# Patient Record
Sex: Male | Born: 1985 | Race: White | Hispanic: No | Marital: Married | Smoking: Former smoker
Health system: Southern US, Community
[De-identification: ages and names within clinical notes are randomized; demographics above are authoritative.]

## PROBLEM LIST (undated history)

## (undated) DIAGNOSIS — K121 Other forms of stomatitis: Secondary | ICD-10-CM

## (undated) HISTORY — PX: HYDROCELE EXCISION / REPAIR: SUR1145

## (undated) HISTORY — PX: OTHER SURGICAL HISTORY: SHX169

---

## 2010-06-28 ENCOUNTER — Ambulatory Visit: Payer: Self-pay | Admitting: Family Medicine

## 2010-06-28 DIAGNOSIS — L723 Sebaceous cyst: Secondary | ICD-10-CM

## 2010-06-29 ENCOUNTER — Ambulatory Visit: Payer: Self-pay | Admitting: Family Medicine

## 2010-06-30 ENCOUNTER — Encounter: Payer: Self-pay | Admitting: Family Medicine

## 2011-01-26 NOTE — Assessment & Plan Note (Signed)
Summary: R ear lobe swollen x 2 dys rm 2   Vital Signs:  Patient Profile:   25 Years Old Male CC:      R ear swollen x 2 dys Height:     72 inches Weight:      178 pounds O2 Sat:      100 % O2 treatment:    Room Air Temp:     97.0 degrees F oral Pulse rate:   61 / minute Pulse rhythm:   regular Resp:     16 per minute BP sitting:   135 / 84  (right arm) Cuff size:   regular  Vitals Entered By: Areta Haber CMA (June 28, 2010 1:17 PM)                  Current Allergies: No known allergies History of Present Illness Chief Complaint: R ear swollen x 2 dys History of Present Illness:  Subjective:  Patient complains of pain and swelling in his right ear lobe for 2 days.  He tried unsuccessfully to drain it with a needle.  Current Problems: SEBACEOUS CYST, INFECTED (ICD-706.2) FAMILY HISTORY DIABETES 1ST DEGREE RELATIVE (ICD-V18.0)   Current Meds CEPHALEXIN 500 MG TABS (CEPHALEXIN) One by mouth three times daily (every 8 hours)  REVIEW OF SYSTEMS Constitutional Symptoms      Denies fever, chills, night sweats, weight loss, weight gain, and fatigue.  Eyes       Denies change in vision, eye pain, eye discharge, glasses, contact lenses, and eye surgery. Ear/Nose/Throat/Mouth       Complains of ear pain.      Denies hearing loss/aids, change in hearing, ear discharge, dizziness, frequent runny nose, frequent nose bleeds, sinus problems, sore throat, hoarseness, and tooth pain or bleeding.      Comments: R, ear lobe, swollen - x 2 dys Respiratory       Denies dry cough, productive cough, wheezing, shortness of breath, asthma, bronchitis, and emphysema/COPD.  Cardiovascular       Denies murmurs, chest pain, and tires easily with exhertion.    Gastrointestinal       Denies stomach pain, nausea/vomiting, diarrhea, constipation, blood in bowel movements, and indigestion. Genitourniary       Denies painful urination, kidney stones, and loss of urinary  control. Neurological       Denies paralysis, seizures, and fainting/blackouts. Musculoskeletal       Denies muscle pain, joint pain, joint stiffness, decreased range of motion, redness, swelling, muscle weakness, and gout.  Skin       Denies bruising, unusual mles/lumps or sores, and hair/skin or nail changes.  Psych       Denies mood changes, temper/anger issues, anxiety/stress, speech problems, depression, and sleep problems. 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  Fluctuant. Assessment New Problems: SEBACEOUS CYST, INFECTED (ICD-706.2) FAMILY HISTORY DIABETES 1ST DEGREE RELATIVE (ICD-V18.0)   Plan New Medications/Changes: CEPHALEXIN 500 MG TABS (CEPHALEXIN) One by mouth three times daily (every 8 hours)  #  30 x 0, 06/28/2010, Donna Christen MD  New Orders: New Patient Level III [phone] T-Culture, Wound [87070/[id number]] I&D Abscess, Simple / Single [10060] Planning Comments:   Wound culture pending.  Begin Keflex Return tomorrow for packing removal.  Wound precautions.  May take Ibuprofen 200mg , 4 tabs every 8 hours with food as needed   The patient and/or caregiver has been counseled thoroughly with regard to medications prescribed including dosage, schedule, interactions, rationale for use, and possible side effects and they verbalize understanding.  Diagnoses and expected course of recovery discussed and will return if not improved as expected or if the  condition worsens. Patient and/or caregiver verbalized understanding.   PROCEDURE:   I & D Site: Right ear lobule Anesthesia: 1% lidocaine with epinephrine Procedure: Procedure:  Incise and Drain Abscess Risks and benefits of procedure explained to patient and verbal consent granted.  Using sterile technique and local anesthesia with 1% lidocaine with epinephrine, cleansed affected area with Betadine and saline. Identified the most fluctuant area of lesion and incised with a #11 blade.  Expressed approximately 0.5cc blood and purulent material.  Inserted Iodoform gauze packing.  Bandage applied.  Patient tolerated well.  Disposition: Home Prescriptions: CEPHALEXIN 500 MG TABS (CEPHALEXIN) One by mouth three times daily (every 8 hours)  #30 x 0   Entered and Authorized by:   Donna Christen MD   Signed by:   Donna Christen MD on 06/28/2010   Method used:   Print then Give to Patient   RxID:   [id number]   Orders Added: 1)  New Patient Level III [95621] 2)  T-Culture, Wound [87070/87205-70190] 3)  I&D Abscess, Simple / Single [10060]

## 2011-01-26 NOTE — Assessment & Plan Note (Signed)
Summary: FOLLOW UP FOR EAR/TJ  CHIEF COMPLAINT:  Returns for re-check of right ear    ALLERGIES:  None known  Past History, Family History, Social History previously recorded  Subjective:  Patient has no complaints.  He has had no further pain in right ear  Objective:  Right ear lobe:  removed packing.  No purulent drainage noted.  Minimal swelling/tenderness.  Assessment:   Abscess/cyst resolving  Plan:   Bandage applied.  Change bandage daily until healed.  Continue antibiotic.  Apply warm soaks daily. Return for increasing pain/swelling/redness, etc.        Current Allergies: No known allergies   The patient and/or caregiver has been counseled thoroughly with regard to medications prescribed including dosage, schedule, interactions, rationale for use, and possible side effects and they verbalize understanding.  Diagnoses and expected course of recovery discussed and will return if not improved as expected or if the condition worsens. Patient and/or caregiver verbalized understanding.   Orders Added: 1)  No Charge Patient Arrived (NCPA0) [NCPA0]

## 2011-10-18 ENCOUNTER — Inpatient Hospital Stay (INDEPENDENT_AMBULATORY_CARE_PROVIDER_SITE_OTHER)
Admission: RE | Admit: 2011-10-18 | Discharge: 2011-10-18 | Disposition: A | Discharge status: Home / Self Care | Payer: 59 | Source: Ambulatory Visit | Attending: Family Medicine | Admitting: Family Medicine

## 2011-10-18 ENCOUNTER — Encounter: Payer: Self-pay | Admitting: Family Medicine

## 2011-10-18 DIAGNOSIS — L723 Sebaceous cyst: Secondary | ICD-10-CM

## 2011-10-19 ENCOUNTER — Encounter: Payer: Self-pay | Admitting: Family Medicine

## 2011-10-19 ENCOUNTER — Inpatient Hospital Stay (INDEPENDENT_AMBULATORY_CARE_PROVIDER_SITE_OTHER)
Admission: RE | Admit: 2011-10-19 | Discharge: 2011-10-19 | Disposition: A | Discharge status: Home / Self Care | Payer: 59 | Source: Ambulatory Visit | Attending: Family Medicine | Admitting: Family Medicine

## 2011-10-19 DIAGNOSIS — L723 Sebaceous cyst: Secondary | ICD-10-CM

## 2011-10-19 DIAGNOSIS — Z4801 Encounter for change or removal of surgical wound dressing: Secondary | ICD-10-CM

## 2011-10-21 ENCOUNTER — Telehealth (INDEPENDENT_AMBULATORY_CARE_PROVIDER_SITE_OTHER): Payer: Self-pay | Admitting: *Deleted

## 2011-11-29 NOTE — Progress Notes (Signed)
Summary: Recheck ear? procedure rm   Vital Signs:  Patient Profile:   25 Years Old Male CC:      recheck ear Height:     72 inches O2 Sat:      99 % O2 treatment:    Room Air Temp:     98.3 degrees F oral Pulse rate:   73 / minute Resp:     16 per minute BP sitting:   120 / 72  (left arm) Cuff size:   regular  Vitals Entered By: Clemens Catholic LPN (October 19, 2011 6:06 PM)                  Updated Prior Medication List: LORTAB 5 5-500 MG TABS (HYDROCODONE-ACETAMINOPHEN) One or two tabs by mouth hs as needed pain CEPHALEXIN 500 MG TABS (CEPHALEXIN) One by mouth three times daily (every 8 hours)  Current Allergies (reviewed today): No known allergies History of Present Illness Chief Complaint: recheck ear History of Present Illness:  Subjective:  Patient reports that all pain in right earlobe has resolved  REVIEW OF SYSTEMS Constitutional Symptoms      Denies fever, chills, night sweats, weight loss, weight gain, and fatigue.  Eyes       Denies change in vision, eye pain, eye discharge, glasses, contact lenses, and eye surgery. Ear/Nose/Throat/Mouth       Denies hearing loss/aids, change in hearing, ear pain, ear discharge, dizziness, frequent runny nose, frequent nose bleeds, sinus problems, sore throat, hoarseness, and tooth pain or bleeding.  Respiratory       Denies dry cough, productive cough, wheezing, shortness of breath, asthma, bronchitis, and emphysema/COPD.  Cardiovascular       Denies murmurs, chest pain, and tires easily with exhertion.    Gastrointestinal       Denies stomach pain, nausea/vomiting, diarrhea, constipation, blood in bowel movements, and indigestion. Genitourniary       Denies painful urination, kidney stones, and loss of urinary control. Neurological       Denies paralysis, seizures, and fainting/blackouts. Musculoskeletal       Denies muscle pain, joint pain, joint stiffness, decreased range of motion, redness, swelling, muscle  weakness, and gout.  Skin       Denies bruising, unusual mles/lumps or sores, and hair/skin or nail changes.  Psych       Denies mood changes, temper/anger issues, anxiety/stress, speech problems, depression, and sleep problems. Other Comments: Recheck ear.   Past History:  Past Medical History: Reviewed history from 06/28/2010 and no changes required. Unremarkable  Past Surgical History: Reviewed history from 06/28/2010 and no changes required. 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 Family History Diabetes 1st degree relative  Social History: Reviewed history from 06/28/2010 and no changes required. Married Never Smoked Alcohol use-yes - weekends - few Drug use-no Regular exercise-yes   Objective:  Appearance:  Patient appears healthy, stated age, and in no acute distress  Reight ear:  bandage and surgical wick removed:  Mild swelling, no purulent discharge, nontender, decreased erythema Assessment  Assessed SEBACEOUS CYST, INFECTED as improved - Donna Christen MD New Problems: ENCOUNTER CHANGE/REMOVAL SURGICAL WOUND DRESSING (ICD-V58.31)   Plan New Orders: Est. Patient Level II [16109] Planning Comments:   Bandage reapplied; advised to change bandage daily until healed.  Finish antibiotic.  Culture pending. Recommend follow-up with plastic surgeon when healed for cyst resection.   The patient and/or caregiver has been counseled thoroughly  with regard to medications prescribed including dosage, schedule, interactions, rationale for use, and possible side effects and they verbalize understanding.  Diagnoses and expected course of recovery discussed and will return if not improved as expected or if the condition worsens. Patient and/or caregiver verbalized understanding.   Orders Added: 1)  Est. Patient Level II [16109]

## 2011-11-29 NOTE — Progress Notes (Signed)
Summary: EAR CYST...[facility](procedure room)   Vital Signs:  Patient Profile:   25 Years Old Male CC:      cyst in ear Height:     72 inches Weight:      162 pounds O2 Sat:      99 % O2 treatment:    Room Air Temp:     98.5 degrees F oral Pulse rate:   65 / minute Resp:     20 per minute BP sitting:   124 / 79  (left arm) Cuff size:   regular  Vitals Entered By: Linton Flemings RN (October 18, 2011 6:00 PM)                  Updated Prior Medication List: No Medications Current Allergies: No known allergies History of Present Illness Chief Complaint: cyst in ear History of Present Illness:  Subjective:  Patient complains of recurrent painful cyst on his right ear lobe that has become swollen and painful over the past 3 days.  REVIEW OF SYSTEMS Constitutional Symptoms      Denies fever, chills, night sweats, weight loss, weight gain, and fatigue.  Eyes       Complains of eye pain.      Denies change in vision, eye discharge, glasses, contact lenses, and eye surgery. Ear/Nose/Throat/Mouth       Denies hearing loss/aids, change in hearing, ear pain, ear discharge, dizziness, frequent runny nose, frequent nose bleeds, sinus problems, sore throat, hoarseness, and tooth pain or bleeding.  Respiratory       Denies dry cough, productive cough, wheezing, shortness of breath, asthma, bronchitis, and emphysema/COPD.  Cardiovascular       Denies murmurs, chest pain, and tires easily with exhertion.    Gastrointestinal       Denies stomach pain, nausea/vomiting, diarrhea, constipation, blood in bowel movements, and indigestion. Genitourniary       Denies painful urination, kidney stones, and loss of urinary control. Neurological       Complains of headaches.      Denies paralysis, seizures, and fainting/blackouts. Musculoskeletal       Denies muscle pain, joint pain, joint stiffness, decreased range of motion, redness, swelling, muscle weakness, and gout.  Skin       Denies  bruising, unusual mles/lumps or sores, and hair/skin or nail changes.  Psych       Denies mood changes, temper/anger issues, anxiety/stress, speech problems, depression, and sleep problems. Other Comments: cyst to back of right ear lobe, states he had one last year   Past History:  Past Medical History: Reviewed history from 06/28/2010 and no changes required. Unremarkable  Past Surgical History: Reviewed history from 06/28/2010 and no changes required. 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 Family History Diabetes 1st degree relative  Social History: Reviewed history from 06/28/2010 and no changes required. Married Never Smoked Alcohol use-yes - weekends - few Drug use-no Regular exercise-yes   Objective:  Appearance:  Patient appears healthy, stated age, and in no acute distress  Right external ear:  There is a 1cm dia tender swollen fluctuant erythematous cystic lesion on posterior aspect of lobule Assessment New Problems: SEBACEOUS CYST, INFECTED (ICD-706.2)  RECURRENT SEBACEOUS CYST, INFECTED  Plan New Medications/Changes: CEPHALEXIN 500 MG TABS (CEPHALEXIN) One by mouth three times daily (every 8 hours)  #30 x 0, 10/18/2011, Donna Christen MD LORTAB 5 5-500 MG TABS (HYDROCODONE-ACETAMINOPHEN) One or two tabs by mouth  hs as needed pain  #10 (ten) x 0, 10/18/2011, Donna Christen MD  New Orders: T-Culture, Wound [[id number]] I&D Abscess, Simple / Single [10060] Est. Patient Level III [08657] Services provided After hours-Weekends-Holidays [99051] Planning Comments:   Wound culture taken.  Begin Keflex. Analgesic for bedtime. Return tomorrow for packing removal and bandage change Recommend follow-up with plastic surgeon when well for definitive removal of cyst.   The patient and/or caregiver has been counseled thoroughly with regard to medications prescribed including dosage,  schedule, interactions, rationale for use, and possible side effects and they verbalize understanding.  Diagnoses and expected course of recovery discussed and will return if not improved as expected or if the condition worsens. Patient and/or caregiver verbalized understanding.   PROCEDURE:   I & D Site: Right ear lobule Size: 1cm Anesthesia: Local plain 1% lidocaine Procedure: Procedure:  Incise and Drain Abscess Risks and benefits of procedure explained to patient and verbal consent granted.  Using sterile technique and local anesthesia with 1% lidocaine with epinephrine, cleansed affected area with Betadine and saline. Identified the most fluctuant area of lesion and incised with a #11 blade.  Expressed  blood and purulent sebaceous material.  Inserted Iodoform gauze packing.  Bandage applied.  Patient tolerated well.  Disposition: Home Specimen sent to lab for evaluation. Prescriptions: CEPHALEXIN 500 MG TABS (CEPHALEXIN) One by mouth three times daily (every 8 hours)  #30 x 0   Entered and Authorized by:   Donna Christen MD   Signed by:   Donna Christen MD on 10/18/2011   Method used:   Print then Give to Patient   RxID:   [id number] LORTAB 5 5-500 MG TABS (HYDROCODONE-ACETAMINOPHEN) One or two tabs by mouth hs as needed pain  #10 (ten) x 0   Entered and Authorized by:   Donna Christen MD   Signed by:   Donna Christen MD on 10/18/2011   Method used:   Print then Give to Patient   RxID:   [id number]   Orders Added: 1)  T-Culture, Wound [87070/[id number]] 2)  I&D Abscess, Simple / Single [10060] 3)  Est. Patient Level III [42595] 4)  Services provided After hours-Weekends-Holidays [99051]

## 2011-11-29 NOTE — Telephone Encounter (Signed)
  Phone Note Outgoing Call Call back at [facility] Phone [phone]   Call placed by: Lajean Saver RN,  October 21, 2011 2:34 PM Summary of Call: Callback: No answer. Message left with culture result and call back with questions or concerns.

## 2011-11-29 NOTE — Letter (Signed)
Summary: Out of Work  [facility]  [address]   [place], [place] [address]   Phone: [phone]  Fax: [phone]    October 18, 2011   Employee:  KEENE GILKEY    To Whom It May Concern:   For Medical reasons, please excuse the above named employee from work tomorrow.   If you need additional information, please feel free to contact our office.         Sincerely,    Donna Christen MD

## 2012-08-18 ENCOUNTER — Emergency Department
Admission: EM | Admit: 2012-08-18 | Discharge: 2012-08-18 | Disposition: A | Discharge status: Home / Self Care | Payer: 59 | Source: Home / Self Care

## 2012-08-18 ENCOUNTER — Encounter: Payer: Self-pay | Admitting: Emergency Medicine

## 2012-08-18 DIAGNOSIS — K120 Recurrent oral aphthae: Secondary | ICD-10-CM

## 2012-08-18 HISTORY — DX: Other forms of stomatitis: K12.1

## 2012-08-18 MED ORDER — ONE-DAILY MULTI VITAMINS PO TABS: 1.0000 | ORAL_TABLET | Freq: Every day | ORAL | Status: DC

## 2012-08-18 NOTE — ED Provider Notes (Signed)
Agree with exam, assessment, and plan.   Lattie Haw, MD 08/18/12 Windell Moment

## 2012-08-18 NOTE — ED Provider Notes (Signed)
History     CSN: [id number]  Arrival date & time 08/18/12  1528     Chief Complaint  Patient presents with  . Mouth Lesions     HPI Comments: Patient presents today with a c/o of a ulcer on his right pharynx for 5 days.  Denies any pain in throat.  However, does relate, "feels like a popcorn kernel stuck in the back of my throat."  Heat, acidic foods, and stress makes the swelling in throat worse; rest and cool foods makes it better.  Denies fever, chills, headache, nausea/vomiting.  Does relate having postnasal drip prior to the onset of ulcer.  Denies presentation of ulcers/blisters on genitalia.    Patient relates having a hx of aphthous ulcers in the past.  Usually happens once or twice a year.  They are located at inside of lower lip and buccal area. None presented around outside of mouth.  No treatment required in past, usually heals on own in 2-3 days.    The history is provided by the patient.    Past Medical History  Diagnosis Date  . Mouth ulcers     Past Surgical History  Procedure Date  . Cystectomy earlobe     Family History  Problem Relation Age of Onset  . Diabetes Mother   . Stroke Father     History  Substance Use Topics  . Smoking status: Never Smoker   . Smokeless tobacco: Not on file  . Alcohol Use: Yes      Review of Systems  Constitutional: Negative.   HENT:       Ulcer on right pharynx.  "Feels like a popcorn kernel stuck in the back of throat."  Respiratory: Negative.   Cardiovascular: Negative.   Gastrointestinal: Negative.   Genitourinary: Negative.   All other systems reviewed and are negative.    Allergies  Review of patient's allergies indicates no known allergies.  Home Medications   Current Outpatient Rx  Name Route Sig Dispense Refill  . ONE-DAILY MULTI VITAMINS PO TABS Oral Take 1 tablet by mouth daily. 30 tablet 12    BP 128/76  Pulse 68  Temp 97.8 F (36.6 C) (Oral)  Resp 16  Ht 6' (1.829 m)  Wt 173 lb (78.472  kg)  BMI 23.46 kg/m2  SpO2 100%  Physical Exam  Nursing note and vitals reviewed. Constitutional: He is oriented to person, place, and time. He appears well-developed and well-nourished.  HENT:  Head: Normocephalic and atraumatic.  Right Ear: External ear normal.  Left Ear: External ear normal.  Nose: Nose normal.  Mouth/Throat: Posterior oropharyngeal erythema present.         Two ulcers present on right pharynx. + Erythema.  No edema.  Eyes: Conjunctivae and EOM are normal. Pupils are equal, round, and reactive to light.  Neck: Normal range of motion. Neck supple. No thyromegaly present.  Cardiovascular: Normal rate, regular rhythm, normal heart sounds and intact distal pulses.  Exam reveals no friction rub.   No murmur heard. Pulmonary/Chest: Effort normal and breath sounds normal.  Abdominal: Soft. Bowel sounds are normal.  Musculoskeletal: Normal range of motion.  Lymphadenopathy:    He has no cervical adenopathy.  Neurological: He is alert and oriented to person, place, and time.  Skin: Skin is warm and dry.  Psychiatric: He has a normal mood and affect.    ED Course  Procedures    Labs Reviewed  HERPES SIMPLEX VIRUS CULTURE: Pending      1.  Aphthous ulcer of pharynx or hypopharynx       MDM  Most likely Aphthous ulcer of the pharynx.   HSV culture taken to r/o Herpes Simplex 1.  If HSV1 is positive will call in acyclovir. Take multivitamin 1 tab once a day, everyday because any sores in the mouth can be caused by vitamin deficiency. Handout given from [facility] about Aphthous Ulcers. Recommended to make an appointment with ENT specialist if symptoms do not improve or if worsen in 5 weeks.          Junius Roads, NP 08/18/12 [phone]

## 2012-08-18 NOTE — ED Notes (Signed)
Patient reports hx of mouth ulcerations which always spontaneously clear; now has one on right tonsil x 5 days that is not going away. No recent OTCs.

## 2012-08-21 LAB — HERPES SIMPLEX VIRUS CULTURE: Organism ID, Bacteria: NOT DETECTED

## 2012-08-23 ENCOUNTER — Telehealth: Payer: Self-pay | Admitting: *Deleted

## 2012-12-09 ENCOUNTER — Emergency Department
Admission: EM | Admit: 2012-12-09 | Discharge: 2012-12-09 | Disposition: A | Discharge status: Home / Self Care | Payer: Self-pay | Source: Home / Self Care | Attending: Family Medicine | Admitting: Family Medicine

## 2012-12-09 ENCOUNTER — Encounter: Payer: Self-pay | Admitting: *Deleted

## 2012-12-09 DIAGNOSIS — S91009A Unspecified open wound, unspecified ankle, initial encounter: Secondary | ICD-10-CM

## 2012-12-09 DIAGNOSIS — S81012A Laceration without foreign body, left knee, initial encounter: Secondary | ICD-10-CM

## 2012-12-09 NOTE — ED Provider Notes (Signed)
History     CSN: [id number]  Arrival date & time 12/09/12  1400   First MD Initiated Contact with Patient 12/09/12 1421      Chief Complaint  Patient presents with  . Laceration     HPI Comments: The patient cut his left anterior knee with a box cutter just prior to arrival.  He states that he had a Tdap about 3 weeks ago.  Patient is a 26 y.o. male presenting with skin laceration. The history is provided by the patient.  Laceration  The incident occurred less than 1 hour ago. Pain location: left knee. Size: 1.5cm. The laceration mechanism was a a clean knife. The pain is mild. The pain has been constant since onset. He reports no foreign bodies present. His tetanus status is UTD.    Past Medical History  Diagnosis Date  . Mouth ulcers     Past Surgical History  Procedure Date  . Cystectomy earlobe     Family History  Problem Relation Age of Onset  . Diabetes Mother   . Stroke Father     History  Substance Use Topics  . Smoking status: Never Smoker   . Smokeless tobacco: Not on file  . Alcohol Use: Yes      Review of Systems  All other systems reviewed and are negative.    Allergies  Review of patient's allergies indicates no known allergies.  Home Medications   Current Outpatient Rx  Name  Route  Sig  Dispense  Refill  . ONE-DAILY MULTI VITAMINS PO TABS   Oral   Take 1 tablet by mouth daily.   30 tablet   12     BP 135/81  Pulse 65  Temp 97.5 F (36.4 C) (Oral)  Resp 20  Ht 6' (1.829 m)  Wt 182 lb (82.555 kg)  BMI 24.68 kg/m2  SpO2 98%  Physical Exam  Nursing note and vitals reviewed. Constitutional: He is oriented to person, place, and time. He appears well-developed and well-nourished. No distress.  HENT:  Head: Atraumatic.  Eyes: Conjunctivae normal are normal. Pupils are equal, round, and reactive to light.  Neurological: He is alert and oriented to person, place, and time.  Skin: Skin is warm and dry.          There is a  1.5cm long superficial linear laceration over the medial aspect of left knee as noted on diagram.  The laceration extends to subcutaneous fat.  No foreign body noted.    ED Course  Procedures   Laceration Repair Discussed benefits and risks of procedure and verbal consent obtained. Using sterile technique and local 1% lidocaine with epinephrine, cleansed wound with Betadine followed by copious lavage with normal saline.  Wound carefully inspected for debris and foreign bodies; none found.  Wound closed with #3, 4-0 interrupted nylon sutures.  Bacitracin and non-stick sterile dressing applied.  Wound precautions explained to patient.  Return for suture removal in 12 days.       1. Laceration of left knee       MDM  Keep wound clean and dry.  Change bandage daily and apply Bacitracin ointment.  Return for any signs of infection.  Discussed wound precautions. Return 12 days for suture removal        Lattie Haw, MD 12/09/12 [phone]

## 2012-12-09 NOTE — ED Notes (Addendum)
Patient has laceration on knee cut himself with a box cutter 35-40 min ago. Patient had Tetanus shot 3 wks ago.

## 2012-12-21 ENCOUNTER — Emergency Department
Admission: EM | Admit: 2012-12-21 | Discharge: 2012-12-21 | Disposition: A | Discharge status: Home / Self Care | Payer: Self-pay | Source: Home / Self Care

## 2012-12-21 ENCOUNTER — Encounter: Payer: Self-pay | Admitting: *Deleted

## 2012-12-21 DIAGNOSIS — Z4802 Encounter for removal of sutures: Secondary | ICD-10-CM

## 2012-12-21 NOTE — ED Notes (Signed)
Patient here to get sutures removed.

## 2012-12-21 NOTE — ED Provider Notes (Signed)
History     CSN: [id number]  Arrival date & time 12/21/12  1004   None     Chief Complaint  Patient presents with  . Suture / Staple Removal   Patient is a 26 y.o. male presenting with suture removal.  Suture / Staple Removal  The sutures were placed 11 to 14 days ago. Treatments since wound repair include antibiotic ointment use. Fever duration: no fever  There has been no drainage from the wound. There is no redness present. There is no swelling present. The pain has no pain. He has no difficulty moving the affected extremity or digit.    Past Medical History  Diagnosis Date  . Mouth ulcers     Past Surgical History  Procedure Date  . Cystectomy earlobe     Family History  Problem Relation Age of Onset  . Diabetes Mother   . Stroke Father     History  Substance Use Topics  . Smoking status: Never Smoker   . Smokeless tobacco: Not on file  . Alcohol Use: Yes      Review of Systems  All other systems reviewed and are negative.    Allergies  Review of patient's allergies indicates no known allergies.  Home Medications   Current Outpatient Rx  Name  Route  Sig  Dispense  Refill  . ONE-DAILY MULTI VITAMINS PO TABS   Oral   Take 1 tablet by mouth daily.   30 tablet   12     BP 126/85  Pulse 62  Temp 97.9 F (36.6 C) (Oral)  Resp 16  Ht 6' (1.829 m)  Wt 180 lb 12 oz (81.988 kg)  BMI 24.51 kg/m2  SpO2 99%  Physical Exam  Constitutional: He appears well-developed and well-nourished.  HENT:  Head: Normocephalic and atraumatic.  Eyes: Conjunctivae normal are normal. Pupils are equal, round, and reactive to light.  Neck: Normal range of motion. Neck supple.  Cardiovascular: Normal rate, regular rhythm and normal heart sounds.   Pulmonary/Chest: Effort normal and breath sounds normal.  Abdominal: Soft. Bowel sounds are normal.  Musculoskeletal:       Legs:      Laceration overall well healed.  No erythema or purulence   Neurological: He is  alert.  Skin: Skin is warm.    ED Course  Procedures (including critical care time)  Labs Reviewed - No data to display No results found.   1. Visit for suture removal       MDM  Sutures removed at bedside.  Overall area well healed.  Discussed general care and infectious red flags.     The patient and/or caregiver has been counseled thoroughly with regard to treatment plan and/or medications prescribed including dosage, schedule, interactions, rationale for use, and possible side effects and they verbalize understanding. Diagnoses and expected course of recovery discussed and will return if not improved as expected or if the condition worsens. Patient and/or caregiver verbalized understanding.             Doree Albee, MD 12/21/12 [phone]

## 2014-01-09 ENCOUNTER — Emergency Department
Admission: EM | Admit: 2014-01-09 | Discharge: 2014-01-09 | Disposition: A | Discharge status: Home / Self Care | Payer: Self-pay | Source: Home / Self Care | Attending: Family Medicine | Admitting: Family Medicine

## 2014-01-09 ENCOUNTER — Encounter: Payer: Self-pay | Admitting: Emergency Medicine

## 2014-01-09 ENCOUNTER — Emergency Department (INDEPENDENT_AMBULATORY_CARE_PROVIDER_SITE_OTHER): Payer: Self-pay

## 2014-01-09 DIAGNOSIS — R071 Chest pain on breathing: Secondary | ICD-10-CM

## 2014-01-09 DIAGNOSIS — R05 Cough: Secondary | ICD-10-CM

## 2014-01-09 DIAGNOSIS — R0781 Pleurodynia: Secondary | ICD-10-CM

## 2014-01-09 DIAGNOSIS — R079 Chest pain, unspecified: Secondary | ICD-10-CM

## 2014-01-09 DIAGNOSIS — R059 Cough, unspecified: Secondary | ICD-10-CM

## 2014-01-09 MED ORDER — MELOXICAM 15 MG PO TABS: 15.0000 mg | ORAL_TABLET | Freq: Every day | ORAL | Status: DC

## 2014-01-09 NOTE — Discharge Instructions (Signed)
May take Tylenol for pain.  Apply ice pack two or three times daily.   Chest Wall Pain Chest wall pain is pain in or around the bones and muscles of your chest. It may take up to 6 weeks to get better. It may take longer if you must stay physically active in your work and activities.  CAUSES  Chest wall pain may happen on its own. However, it may be caused by:  A viral illness like the flu.  Injury.  Coughing.  Exercise.  Arthritis.  Fibromyalgia.  Shingles. HOME CARE INSTRUCTIONS   Avoid overtiring physical activity. Try not to strain or perform activities that cause pain. This includes any activities using your chest or your abdominal and side muscles, especially if heavy weights are used.  Put ice on the sore area.  Put ice in a plastic bag.  Place a towel between your skin and the bag.  Leave the ice on for 15-20 minutes per hour while awake for the first 2 days.  Only take over-the-counter or prescription medicines for pain, discomfort, or fever as directed by your caregiver. SEEK IMMEDIATE MEDICAL CARE IF:   Your pain increases, or you are very uncomfortable.  You have a fever.  Your chest pain becomes worse.  You have new, unexplained symptoms.  You have nausea or vomiting.  You feel sweaty or lightheaded.  You have a cough with phlegm (sputum), or you cough up blood. MAKE SURE YOU:   Understand these instructions.  Will watch your condition.  Will get help right away if you are not doing well or get worse. Document Released: 12/13/2005 Document Revised: 03/06/2012 Document Reviewed: 08/09/2011 [facility] Patient Information 2014 [place], [place].

## 2014-01-09 NOTE — ED Notes (Signed)
Evan Porter reports possibly having the flu the week of Christmas. Since, the cough has persisted. About 2 days ago he developed right sided CP with laughing, coughing and deep breathing. No pain @ rest, no other associated symptoms.

## 2014-01-09 NOTE — ED Provider Notes (Signed)
CSN: [id number]     Arrival date & time 01/09/14  0859 History   First MD Initiated Contact with Patient 01/09/14 [phone]     Chief Complaint  Patient presents with  . Chest Pain      HPI Comments: Patient developed distinct flu symptoms about 3 weeks ago with myalgias, fatigue, sinus congestion, and cough.  Most symptoms resolved except for persistent non-productive cough.  Over the past 3 to 4 days he has developed pleuritic pain in his right anterior/lateral/inferior chest.  No shortness of breath.  No fevers, chills, and sweats   The history is provided by the patient.    Past Medical History  Diagnosis Date  . Mouth ulcers    Past Surgical History  Procedure Laterality Date  . Cystectomy earlobe     Family History  Problem Relation Age of Onset  . Diabetes Mother   . Stroke Father    History  Substance Use Topics  . Smoking status: Former Smoker    Quit date: 01/09/2013  . Smokeless tobacco: Never Used  . Alcohol Use: Yes     Comment: 12/week    Review of Systems No sore throat + cough + pleuritic pain No wheezing No nasal congestion No post-nasal drainage No sinus pain/pressure No itchy/red eyes No earache No hemoptysis No SOB No fever/chills No nausea No vomiting No abdominal pain No diarrhea No urinary symptoms No skin rash No fatigue No myalgias No headache Used OTC meds without relief  Allergies  Review of patient's allergies indicates no known allergies.  Home Medications   Current Outpatient Rx  Name  Route  Sig  Dispense  Refill  . meloxicam (MOBIC) 15 MG tablet   Oral   Take 1 tablet (15 mg total) by mouth daily. Take each evening with supper   15 tablet   0   . Multiple Vitamin (MULTIVITAMIN) tablet   Oral   Take 1 tablet by mouth daily.   30 tablet   12    BP 129/77  Pulse 84  Temp(Src) 98.5 F (36.9 C) (Oral)  Resp 14  Wt 172 lb (78.019 kg)  SpO2 98% Physical Exam  Nursing note and vitals reviewed. Constitutional: He  is oriented to person, place, and time. He appears well-developed and well-nourished. No distress.  HENT:  Head: Normocephalic.  Nose: Nose normal.  Mouth/Throat: Oropharynx is clear and moist.  Eyes: Conjunctivae are normal. Pupils are equal, round, and reactive to light.  Neck: Neck supple.  Cardiovascular: Normal heart sounds.   Pulmonary/Chest: Breath sounds normal. No respiratory distress. He has no wheezes. He has no rales.   He exhibits tenderness and bony tenderness. He exhibits no crepitus.    There is rib and intercostal muscle tenderness to palpation over right anterior/lateral/inferior ribs as noted on diagram.  Abdominal: There is no tenderness.  Lymphadenopathy:    He has no cervical adenopathy.  Neurological: He is alert and oriented to person, place, and time.  Skin: Skin is warm and dry.    ED Course  Procedures  None     Imaging Review Dg Ribs Unilateral W/chest Right  01/09/2014   CLINICAL DATA:  Cough for 3 weeks; 4 day history of right anterior/lateral pleuritic chest pain  EXAM: RIGHT RIBS AND CHEST - 3+ VIEW  COMPARISON:  None.  FINDINGS: No fracture or other bone lesions are seen involving the ribs. There is no evidence of pneumothorax or pleural effusion. Both lungs are clear. Heart size and mediastinal contours  are within normal limits.  IMPRESSION: No acute cardiopulmonary findings.  No right rib abnormality   Electronically Signed   By: Genevive Bi M.D.   On: 01/09/2014 09:52      MDM   1. Rib pain on right side; suspect intercostal muscle strain    Patient declines rib belt Begin Mobic. May take Tylenol for pain.  Apply ice pack two or three times daily. Followup with Family Doctor if not improved in one week.     Lattie Haw, MD 01/10/14 [phone]

## 2014-01-09 NOTE — ED Notes (Signed)
Attempted rib belt, patient did not feel any relief, declined.

## 2014-01-30 ENCOUNTER — Emergency Department
Admission: EM | Admit: 2014-01-30 | Discharge: 2014-01-30 | Disposition: A | Discharge status: Home / Self Care | Payer: Self-pay | Source: Home / Self Care | Attending: Family Medicine | Admitting: Family Medicine

## 2014-01-30 ENCOUNTER — Encounter: Payer: Self-pay | Admitting: Emergency Medicine

## 2014-01-30 DIAGNOSIS — M549 Dorsalgia, unspecified: Secondary | ICD-10-CM

## 2014-01-30 MED ORDER — PREDNISONE 20 MG PO TABS: 20.0000 mg | ORAL_TABLET | Freq: Two times a day (BID) | ORAL | Status: DC

## 2014-01-30 MED ORDER — METAXALONE 800 MG PO TABS: 800.0000 mg | ORAL_TABLET | Freq: Three times a day (TID) | ORAL | Status: DC

## 2014-01-30 NOTE — ED Provider Notes (Signed)
CSN: [id number]     Arrival date & time 01/30/14  1310 History   First MD Initiated Contact with Patient 01/30/14 1358     Chief Complaint  Patient presents with  . Chest Pain  . Back Pain      HPI Comments: Patient was treated here 3 weeks ago for costochondritis following a flu-like illness.  He reports that the anterior chest pain has improved, but he now has persistent pain in his right upper back around his shoulder blade.  He reports that he went bowling last night, and the pain is acutely worse today.  No cough.  No fevers, chills, and sweats.  He feels well otherwise.  Patient is a 28 y.o. male presenting with chest pain. The history is provided by the patient.  Chest Pain Chest pain location: right upper back. Pain quality: aching   Pain severity:  Mild Onset quality:  Gradual Duration:  1 week Timing:  Constant Progression:  Unchanged Chronicity:  Recurrent Context: lifting, movement and raising an arm   Worsened by:  Movement Ineffective treatments: tylenol and NSAIDS. Associated symptoms: no abdominal pain, no cough, no diaphoresis, no fever, no heartburn, no lower extremity edema, no nausea, no palpitations and no shortness of breath     Past Medical History  Diagnosis Date  . Mouth ulcers    Past Surgical History  Procedure Laterality Date  . Cystectomy earlobe     Family History  Problem Relation Age of Onset  . Diabetes Mother   . Stroke Father    History  Substance Use Topics  . Smoking status: Former Smoker    Quit date: 01/09/2013  . Smokeless tobacco: Never Used  . Alcohol Use: Yes     Comment: 12/week    Review of Systems  Constitutional: Negative for fever and diaphoresis.  Respiratory: Negative for cough and shortness of breath.   Cardiovascular: Positive for chest pain. Negative for palpitations.  Gastrointestinal: Negative for heartburn, nausea and abdominal pain.  All other systems reviewed and are negative.    Allergies  Review of  patient's allergies indicates no known allergies.  Home Medications   Current Outpatient Rx  Name  Route  Sig  Dispense  Refill  . metaxalone (SKELAXIN) 800 MG tablet   Oral   Take 1 tablet (800 mg total) by mouth 3 (three) times daily.   12 tablet   1   . Multiple Vitamin (MULTIVITAMIN) tablet   Oral   Take 1 tablet by mouth daily.   30 tablet   12   . predniSONE (DELTASONE) 20 MG tablet   Oral   Take 1 tablet (20 mg total) by mouth 2 (two) times daily. Take with food.   10 tablet   0    BP 131/74  Pulse 54  Temp(Src) 97.5 F (36.4 C) (Oral)  Resp 16  Wt 173 lb (78.472 kg)  SpO2 100% Physical Exam  Nursing note and vitals reviewed. Constitutional: He is oriented to person, place, and time. He appears well-developed and well-nourished. No distress.  HENT:  Head: Atraumatic.  Mouth/Throat: Oropharynx is clear and moist.  Eyes: Conjunctivae are normal. Pupils are equal, round, and reactive to light.  Neck: Normal range of motion. Neck supple.  Cardiovascular: Normal heart sounds.   Pulmonary/Chest: Breath sounds normal. No respiratory distress. He has no wheezes. He has no rales. He exhibits tenderness.  Abdominal: Soft. Bowel sounds are normal. There is no tenderness.  Musculoskeletal: He exhibits no edema and no  tenderness.       Back:  Patient's area of pain is in the right lower rhomboid area, worse with resisted adduction of right shoulder, but not tender to palpation.  Lymphadenopathy:    He has no cervical adenopathy.  Neurological: He is alert and oriented to person, place, and time.  Skin: Skin is warm and dry. No rash noted.    ED Course  Procedures  none       MDM   1. Upper back pain on right side; suspect rhomboid inflammation    Stop Mobic.  Begin prednisone burst.  Begin Skelaxin. Apply ice pack two or three times daily.  Began upper back muscle exercises. Followup with Dr. Rodney Langtonhomas Thekkekand[facility] ([facility]) if not improving one  week    Lattie HawStephen A Beese, MD 02/03/14 1009

## 2014-01-30 NOTE — Discharge Instructions (Signed)
Apply ice pack two or three times daily.  Began upper back muscle exercises.

## 2014-01-30 NOTE — ED Notes (Signed)
Pt reports being treated for costochondritis 2-3 wks ago. He reports that his RT rib area pain is no better and now radiates to his mid/upper back. He reports taking tylenol and NSAIDS with minimal relief.

## 2014-12-03 ENCOUNTER — Emergency Department
Admission: EM | Admit: 2014-12-03 | Discharge: 2014-12-03 | Disposition: A | Discharge status: Home / Self Care | Payer: 59 | Source: Home / Self Care | Attending: Physician Assistant | Admitting: Physician Assistant

## 2014-12-03 ENCOUNTER — Emergency Department (INDEPENDENT_AMBULATORY_CARE_PROVIDER_SITE_OTHER): Payer: 59

## 2014-12-03 ENCOUNTER — Encounter: Payer: Self-pay | Admitting: *Deleted

## 2014-12-03 DIAGNOSIS — N50811 Right testicular pain: Secondary | ICD-10-CM

## 2014-12-03 DIAGNOSIS — I861 Scrotal varices: Secondary | ICD-10-CM | POA: Diagnosis not present

## 2014-12-03 DIAGNOSIS — N508 Other specified disorders of male genital organs: Secondary | ICD-10-CM | POA: Diagnosis not present

## 2014-12-03 DIAGNOSIS — R1031 Right lower quadrant pain: Secondary | ICD-10-CM

## 2014-12-03 LAB — POCT URINALYSIS DIP (MANUAL ENTRY)
Bilirubin, UA: NEGATIVE
GLUCOSE UA: NEGATIVE
Ketones, POC UA: NEGATIVE
Leukocytes, UA: NEGATIVE
Nitrite, UA: NEGATIVE
PH UA: 6 (ref 5–8)
Protein Ur, POC: NEGATIVE
RBC UA: NEGATIVE
Spec Grav, UA: 1.025 (ref 1.005–1.03)
UROBILINOGEN UA: 0.2 (ref 0–1)

## 2014-12-03 MED ORDER — CEFTRIAXONE SODIUM 250 MG IJ SOLR
250.0000 mg | Freq: Once | INTRAMUSCULAR | Status: AC
  Administered 2014-12-03: 250 mg via INTRAMUSCULAR

## 2014-12-03 MED ORDER — HYDROCODONE-ACETAMINOPHEN 5-325 MG PO TABS: 1.0000 | ORAL_TABLET | Freq: Three times a day (TID) | ORAL | Status: DC | PRN

## 2014-12-03 MED ORDER — DOXYCYCLINE HYCLATE 100 MG PO TABS: 100.0000 mg | ORAL_TABLET | Freq: Two times a day (BID) | ORAL | Status: AC

## 2014-12-03 NOTE — Discharge Instructions (Signed)
Varicocele A varicocele is a swelling of veins in the scrotum (the bag of skin that contains the testicles). It is most common in young men. It occurs most often on the left side. Small or painless varicoceles do not need treatment. Most often, this is not a serious problem, but further tests may be needed to confirm the diagnosis. Surgery may be needed if complications of varicoceles arise. Rarely, varicoceles can reoccur after surgery. CAUSES  The swelling is due to blood backing up in the vein that leads from the testicle back to the body. Blood backs up because the valves inside the vein are not working properly. Veins normally return blood to the heart. Valves in veins are supposed to be one-way valves. They should not allow blood to flow backwards. If the valves do not work well, blood can pool in a vein and make it swell. The same thing happens with varicose veins in the leg. SYMPTOMS  A varicocele most often causes no symptoms. When they occur, symptoms include:   Swelling on one side of the scrotum.  Swelling that is more obvious when standing up.  A lumpy feeling in the scrotum.  Heaviness on one side of the scrotum.  Dull ache in the scrotum, especially after exercise or prolonged standing or sitting.  Slower growth or reduced size of the testicle on the side of the varicocele (in young males).  Problems with fertility can arise if the testicle does not grow normally. DIAGNOSIS  Varicocele is usually diagnosed by a physical exam. Sometimes ultrasonography is done. TREATMENT  Usually, varicoceles need no treatment. They are often routinely monitored on exam by your caregiver to ensure they do not slow the growth of the testicle on that side. Treatment may be needed if:  The varicocele is large.  There is a lot of pain.  The varicocele causes a decrease in the size of the testicle in a growing adolescent.  The other testicle is absent or not normal.  Varicoceles are found on  both sides of the scrotum.  There is pain when exercising.  There are fertility problems. There are two types of treatment:  Surgery. The surgeon ties off the swollen veins. Surgery may be done with an incision in the skin or through a laparoscope. The surgery is usually done in an outpatient setting. Outpatient means there is no overnight stay in a hospital.  Embolization. A small tube is placed in a vein and guided into the swollen veins. X-rays are used to guide the small tube. Tiny metal coils or other blocking items are put through the tube. This blocks swollen veins and the flow of blood. This is usually done in an outpatient setting without the use of general anesthesia. HOME CARE INSTRUCTIONS  To decrease discomfort:  Wear supportive underwear.  Use an athletic supporter for sports.  Only take over-the-counter or prescription medicines for pain or discomfort as directed by your caregiver. SEEK MEDICAL CARE IF:   Pain is increasing.  Swelling does not decrease when lying down.  Testicle is smaller.  The testicle becomes enlarged, swollen, red, or painful. Document Released: 03/21/2001 Document Revised: 03/06/2012 Document Reviewed: 03/25/2010 Exit[employer] Patient Information 2015 [employer]. This information is not intended to replace advice given to you by your health care provider. Make sure you discuss any questions you have with your health care provider.  

## 2014-12-03 NOTE — ED Notes (Signed)
Pt c/o RT sided groin pain x last night. Denies injury.

## 2014-12-03 NOTE — ED Provider Notes (Signed)
CSN: [id number]     Arrival date & time 12/03/14  0912 History   First MD Initiated Contact with Patient 12/03/14 [phone]     Chief Complaint  Patient presents with  . Groin Pain   (Consider location/radiation/quality/duration/timing/severity/associated sxs/prior Treatment) HPI  Pt is a 28 yo male who presents to the clinic right side groin and testicular pain that started last night. No injury. Pain started suddenly. Not lifting weight or doing anything new. No pain with urination. No back, abdominal or flank pain. Pain does radiate from right groin up in the right side with occasionally. Rates pain 7/10. Describes as pressure, ache, throb. Has not taken anything or done anything to make better. No fever or chills. Hx of epidymidis 10 years ago but symptoms were gradual and left sided. No penile discharge.   Past Medical History  Diagnosis Date  . Mouth ulcers    Past Surgical History  Procedure Laterality Date  . Cystectomy earlobe     Family History  Problem Relation Age of Onset  . Diabetes Mother   . Stroke Father    History  Substance Use Topics  . Smoking status: Former Smoker    Quit date: 01/09/2013  . Smokeless tobacco: Never Used  . Alcohol Use: Yes     Comment: 12/week    Review of Systems  All other systems reviewed and are negative.   Allergies  Review of patient's allergies indicates no known allergies.  Home Medications   Prior to Admission medications   Medication Sig Start Date End Date Taking? Authorizing Provider  doxycycline (VIBRA-TABS) 100 MG tablet Take 1 tablet (100 mg total) by mouth 2 (two) times daily. For 14 days. 12/03/14 12/10/14  Jomarie LongsJade L Breeback, PA-C  HYDROcodone-acetaminophen (NORCO/VICODIN) 5-325 MG per tablet Take 1 tablet by mouth every 8 (eight) hours as needed for moderate pain. 12/03/14   Jade L Breeback, PA-C  metaxalone (SKELAXIN) 800 MG tablet Take 1 tablet (800 mg total) by mouth 3 (three) times daily. 01/30/14   Lattie HawStephen A Beese, MD   Multiple Vitamin (MULTIVITAMIN) tablet Take 1 tablet by mouth daily. 08/18/12   Junius RoadsShruti Patel, NP  predniSONE (DELTASONE) 20 MG tablet Take 1 tablet (20 mg total) by mouth 2 (two) times daily. Take with food. 01/30/14   Lattie HawStephen A Beese, MD   BP 149/81 mmHg  Pulse 75  Temp(Src) 98.2 F (36.8 C) (Oral)  Resp 16  Ht 6' (1.829 m)  Wt 186 lb (84.369 kg)  BMI 25.22 kg/m2  SpO2 100% Physical Exam  Constitutional: He appears well-developed and well-nourished.  Genitourinary:  Elevated right testicle that appears to be swollen and very tender to touch.  Cremaster reflex barely seen on right side.  Cremaster reflex great on left side.   Psychiatric: He has a normal mood and affect. His behavior is normal.    ED Course  Procedures (including critical care time) Labs Review Labs Reviewed  POCT URINALYSIS DIP (MANUAL ENTRY)    Imaging Review [place] Scrotum  12/03/2014   CLINICAL DATA:  Right testicle and groin pain since last night.  EXAM: SCROTAL ULTRASOUND  DOPPLER ULTRASOUND OF THE TESTICLES  TECHNIQUE: Complete ultrasound examination of the testicles, epididymis, and other scrotal structures was performed. Color and spectral Doppler ultrasound were also utilized to evaluate blood flow to the testicles.  COMPARISON:  None.  FINDINGS: Right testicle  Measurements: 5.3 x 2.7 x 3.2 cm. No mass or microlithiasis visualized.  Left testicle  Measurements: 4.8 x 2.4 x  2.4 cm. No mass or microlithiasis visualized.  Right epididymis:  Normal in size and appearance.  Left epididymis:  Normal in size and appearance.  Hydrocele:  None visualized.  Varicocele: There is a right varicocele. The area of pain is in the location of the varicocele. There is no visible inguinal hernia.  Pulsed Doppler interrogation of both testes demonstrates low resistance arterial and venous waveforms bilaterally.  IMPRESSION: Normal-appearing testicles with normal perfusion to the testicles. Right varicocele. The possibility of venous  obstruction or retroperitoneal mass should be considered since isolated right-sided varicoceles are rare.   Electronically Signed   By: Geanie CooleyJim  Maxwell M.D.   On: 12/03/2014 10:54   [place] Art/ven Flow Abd Pelv Doppler  12/03/2014   CLINICAL DATA:  Right testicle and groin pain since last night.  EXAM: SCROTAL ULTRASOUND  DOPPLER ULTRASOUND OF THE TESTICLES  TECHNIQUE: Complete ultrasound examination of the testicles, epididymis, and other scrotal structures was performed. Color and spectral Doppler ultrasound were also utilized to evaluate blood flow to the testicles.  COMPARISON:  None.  FINDINGS: Right testicle  Measurements: 5.3 x 2.7 x 3.2 cm. No mass or microlithiasis visualized.  Left testicle  Measurements: 4.8 x 2.4 x 2.4 cm. No mass or microlithiasis visualized.  Right epididymis:  Normal in size and appearance.  Left epididymis:  Normal in size and appearance.  Hydrocele:  None visualized.  Varicocele: There is a right varicocele. The area of pain is in the location of the varicocele. There is no visible inguinal hernia.  Pulsed Doppler interrogation of both testes demonstrates low resistance arterial and venous waveforms bilaterally.  IMPRESSION: Normal-appearing testicles with normal perfusion to the testicles. Right varicocele. The possibility of venous obstruction or retroperitoneal mass should be considered since isolated right-sided varicoceles are rare.   Electronically Signed   By: Geanie CooleyJim  Maxwell M.D.   On: 12/03/2014 10:54     MDM   1. Varicocele   2. Right testicular pain   3. Right groin pain    .Marland Kitchen. Results for orders placed or performed during the hospital encounter of 12/03/14  POCT urinalysis dipstick (new)  Result Value Ref Range   Color, UA yellow    Clarity, UA clear    Glucose, UA neg    Bilirubin, UA negative    Bilirubin, UA negative    Spec Grav, UA 1.025 1.005 - 1.03   Blood, UA negative    pH, UA 6.0 5 - 8   Protein Ur, POC negative    Urobilinogen, UA 0.2 0 - 1    Nitrite, UA Negative    Leukocytes, UA Negative    UA reassuring for no infection.  US showed no torison. Varicocele was found. Discussed results with patient and let him know radiologist reading about further investigation with CT to look for mass if not resolving.  I did go ahead and treat for infectious causes due to under 37 and being sexually active only 2 partners in 5 years.  Rocephin 250mg  IM and doxycycline for 14 days given.  Norco for pain.  Ibuprofen 800mg  as needed up to three times a day.  Encouraged ICE.  HO given on variocele.  Follow up if pain does not improve with PCP.       Jomarie LongsJade L Breeback, PA-C 12/03/14 1559

## 2015-11-23 IMAGING — CR DG RIBS W/ CHEST 3+V*R*
3 series · 3 of 3 positions shown · non-contrast
Comparison: None.

CLINICAL DATA: Cough for 3 weeks; 4 day history of right
anterior/lateral pleuritic chest pain

EXAM:
RIGHT RIBS AND CHEST - 3+ VIEW

[view not recorded (1 of 3)]
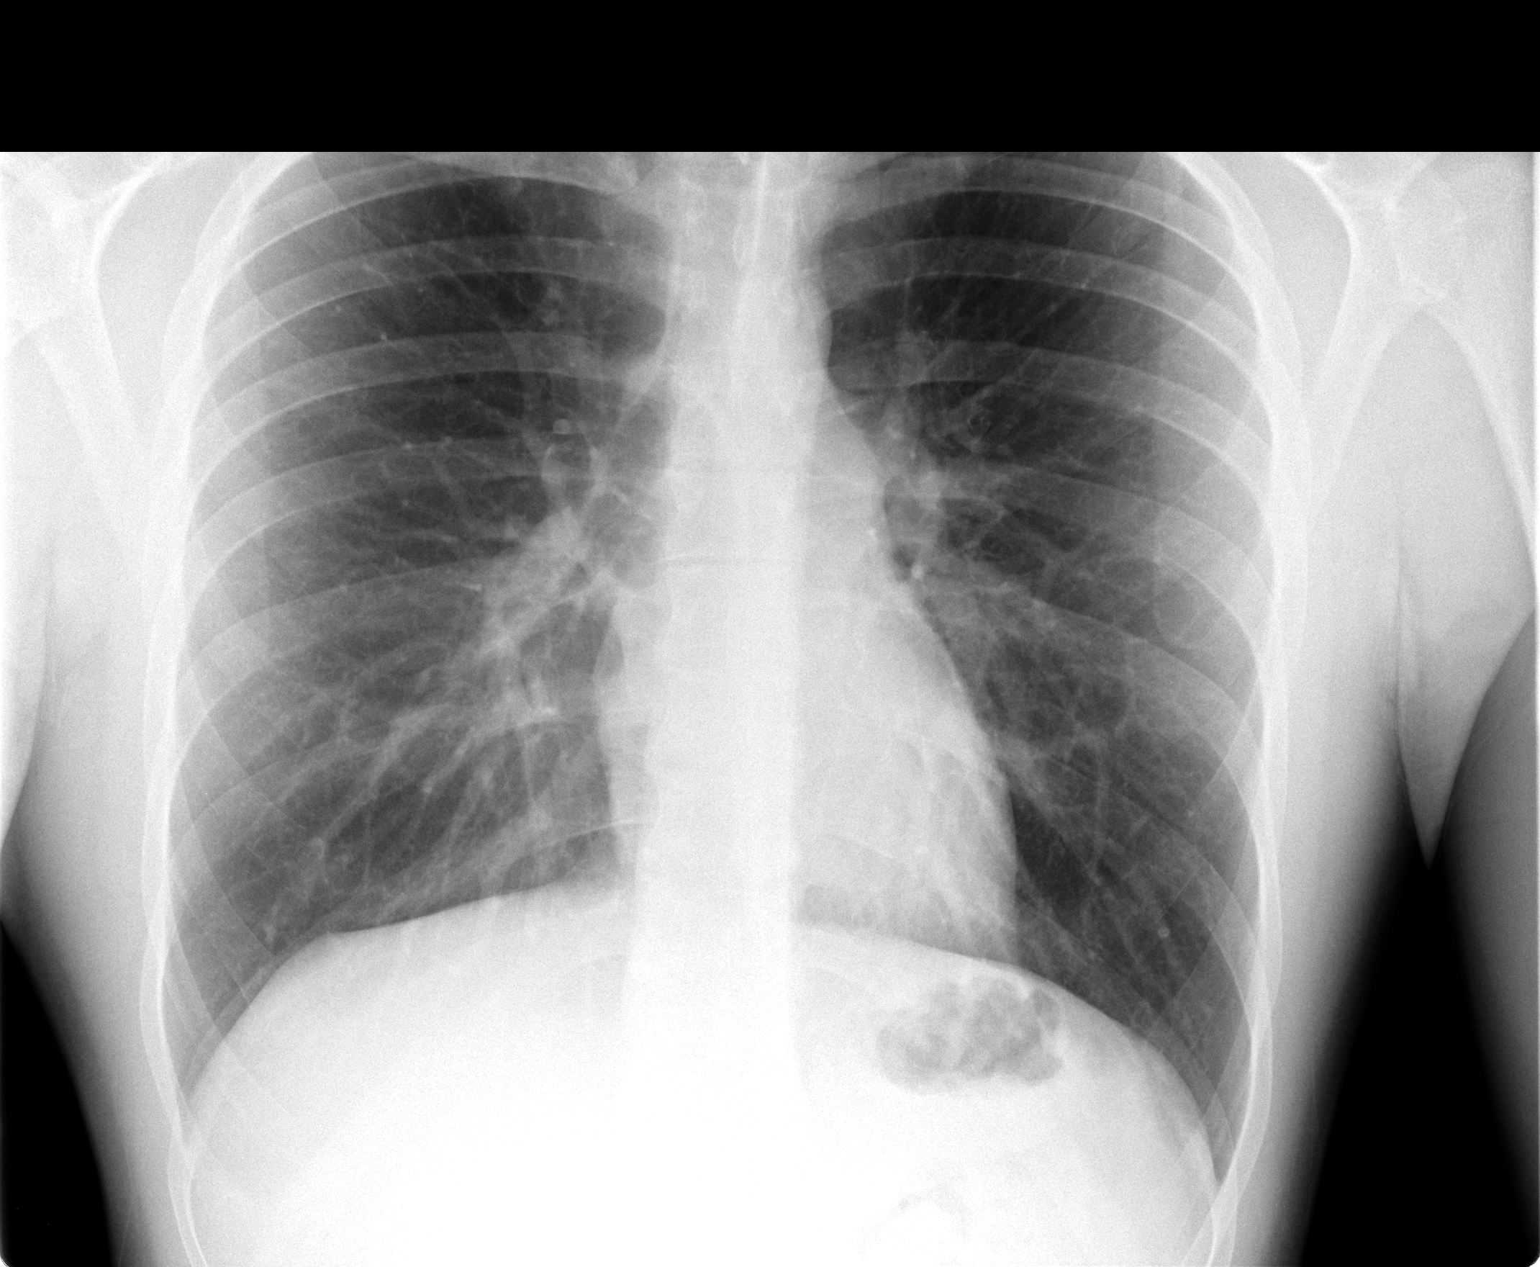

[view not recorded (2 of 3)]
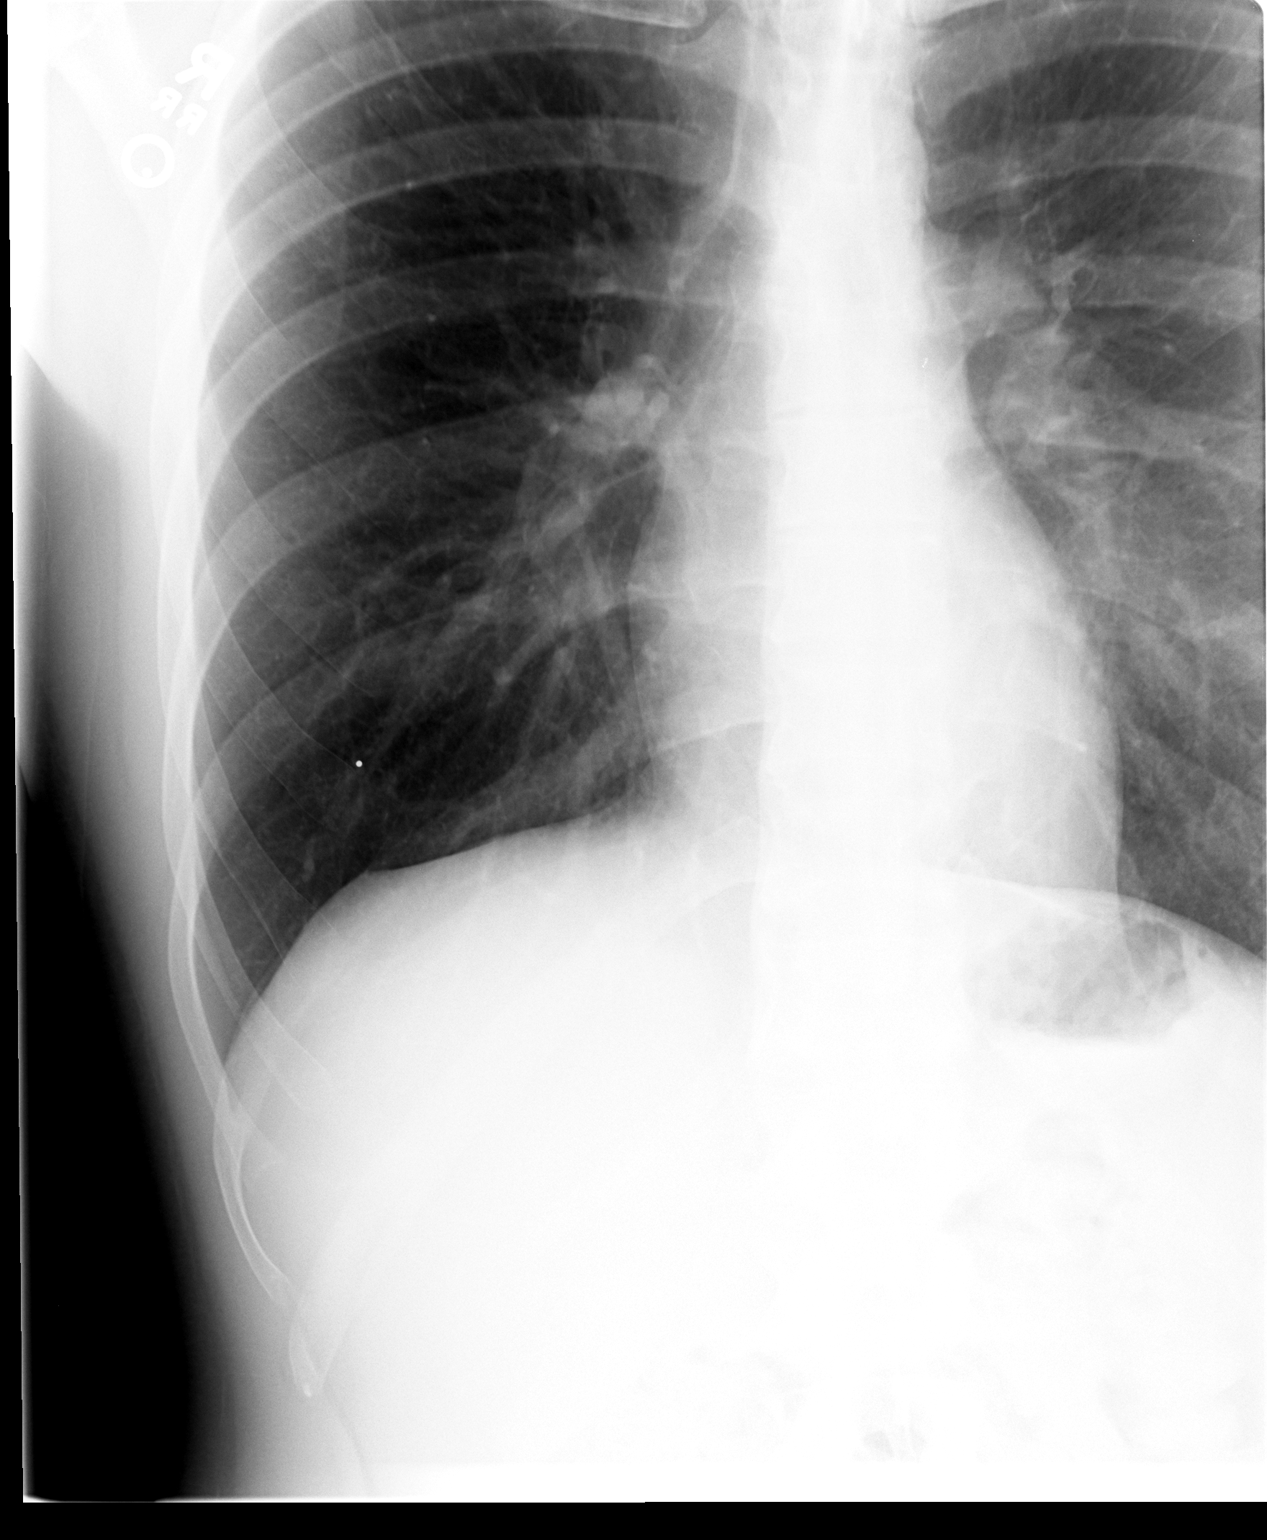

[view not recorded (3 of 3)]
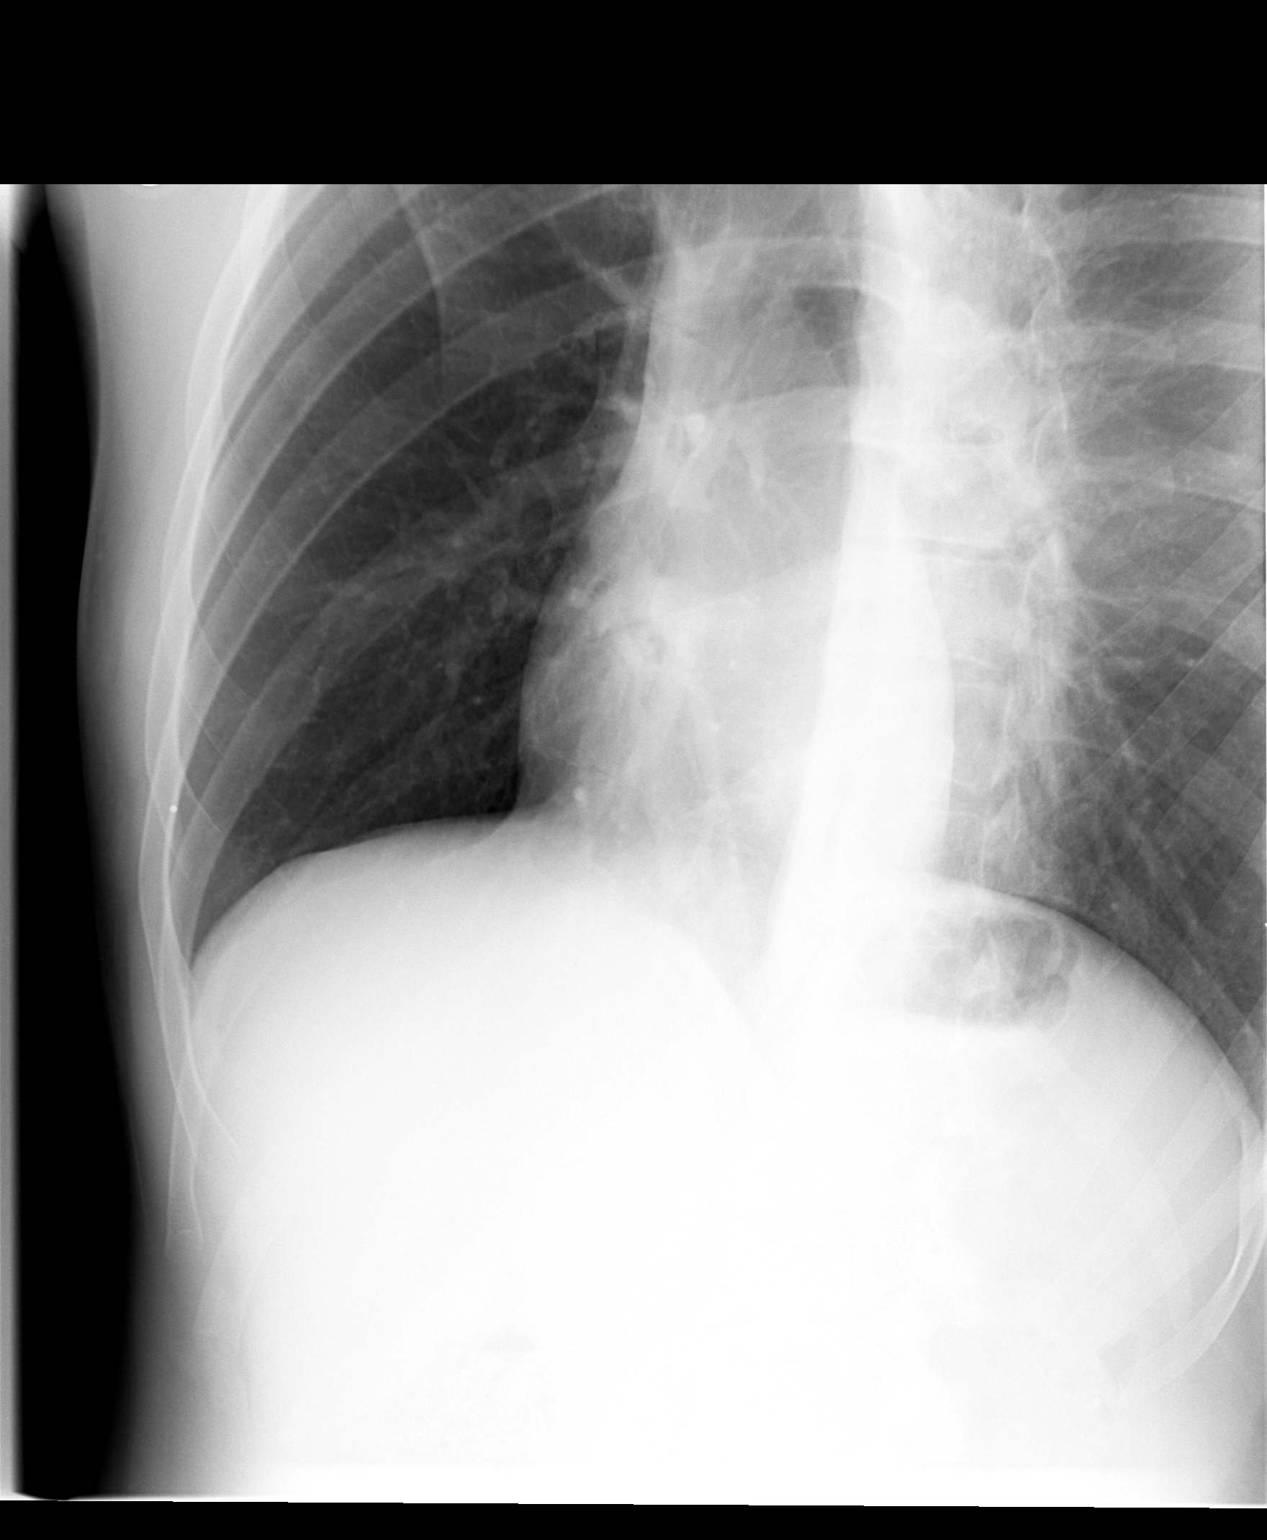

[3 of 3 positions shown; findings below may reference images not displayed]

FINDINGS: No fracture or other bone lesions are seen involving the ribs. There
is no evidence of pneumothorax or pleural effusion. Both lungs are
clear. Heart size and mediastinal contours are within normal limits.
IMPRESSION: No acute cardiopulmonary findings.  No right rib abnormality

## 2016-10-16 IMAGING — US US ART/VEN ABD/PELV/SCROTUM DOPPLER LTD
1 series · 13 of 25 positions shown · non-contrast
Comparison: None.

CLINICAL DATA: Right testicle and groin pain since last night.

EXAM:
SCROTAL ULTRASOUND
DOPPLER ULTRASOUND OF THE TESTICLES
TECHNIQUE: Complete ultrasound examination of the testicles, epididymis, and
other scrotal structures was performed. Color and spectral Doppler
ultrasound were also utilized to evaluate blood flow to the
testicles.

[Series 1: us art/ven abd/pelv/scrotum doppler ltd · 0.07mm/px · 13 of 77 slices shown]
[im 1/77]
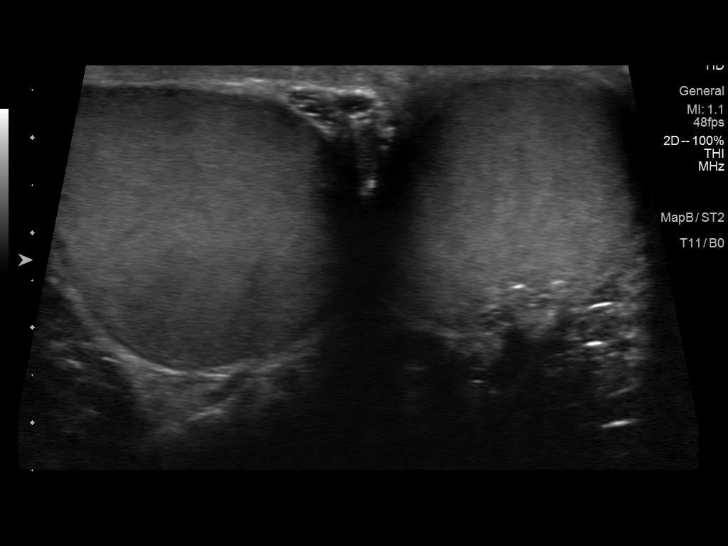
[im 7/77]
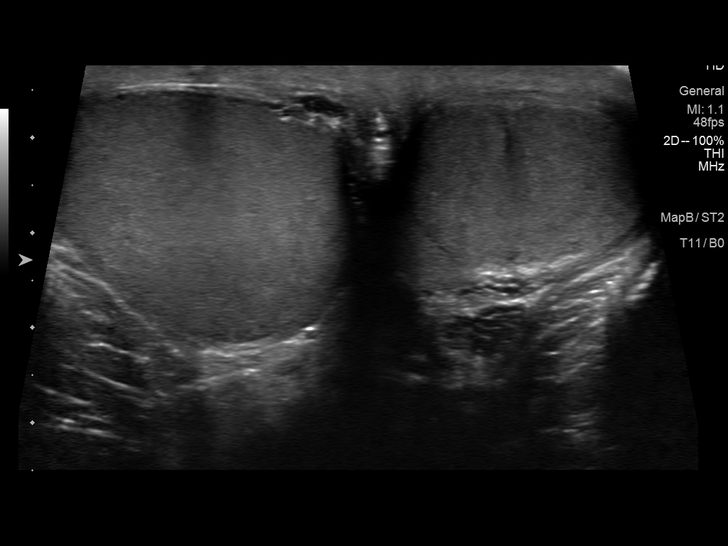
[im 13/77]
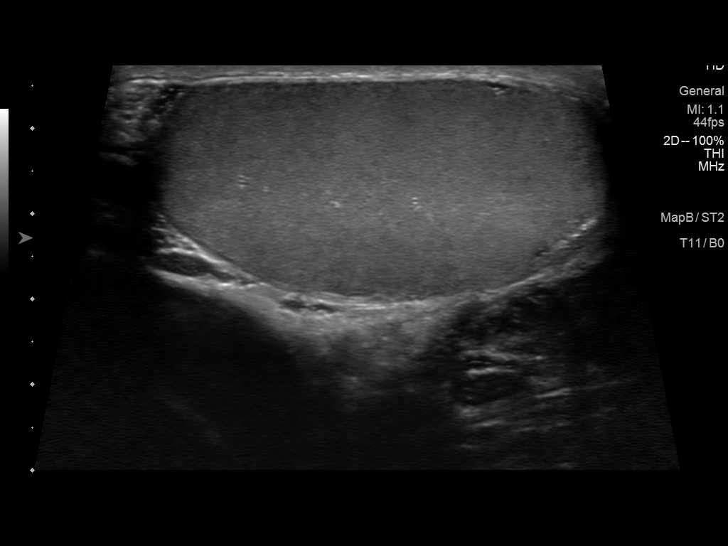
[im 20/77]
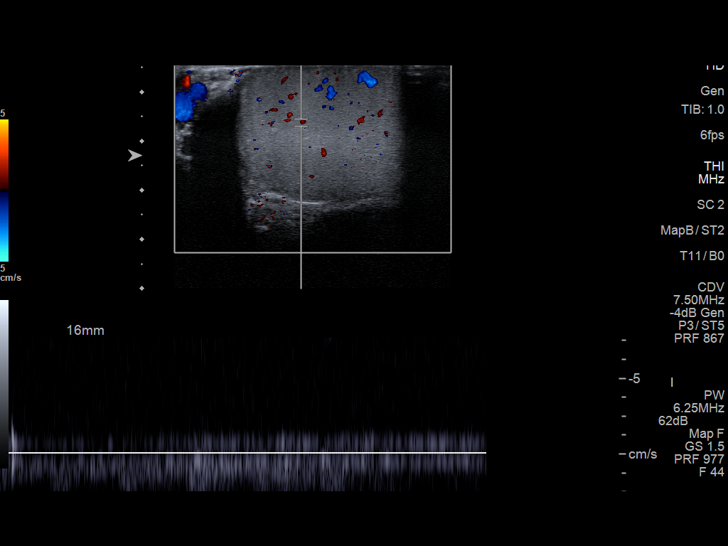
[im 26/77]
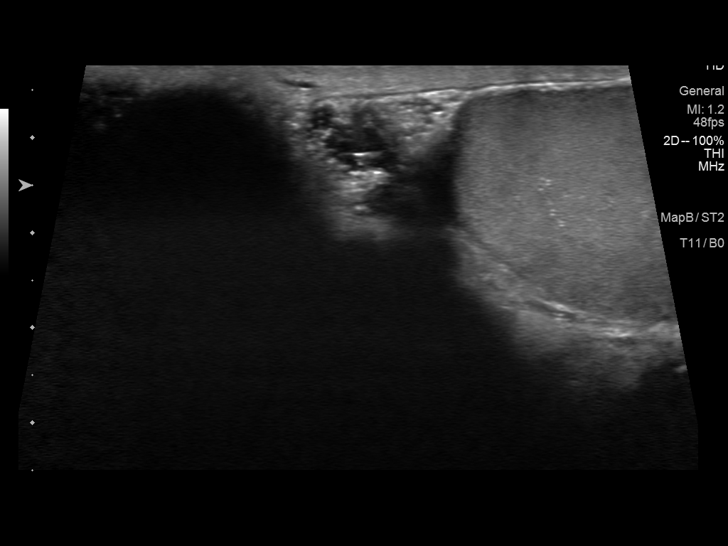
[im 32/77]
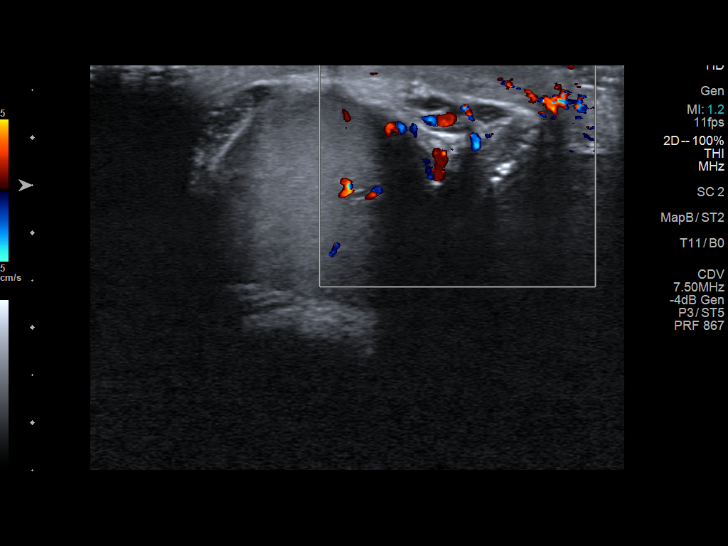
[im 39/77]
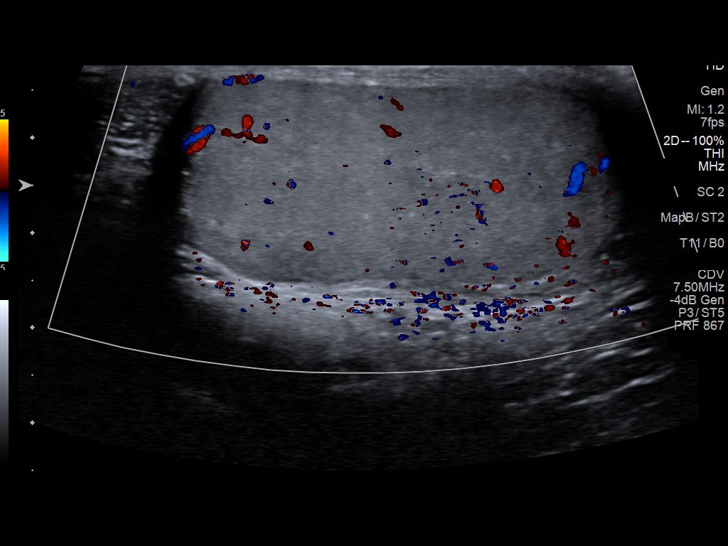
[im 45/77]
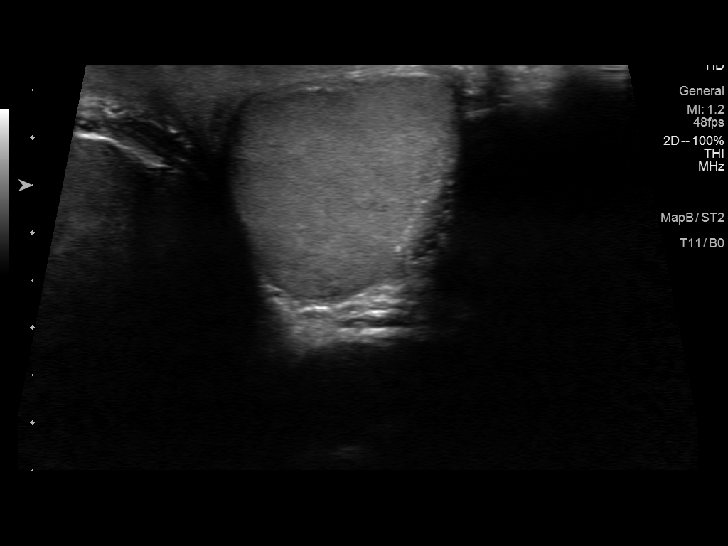
[im 51/77]
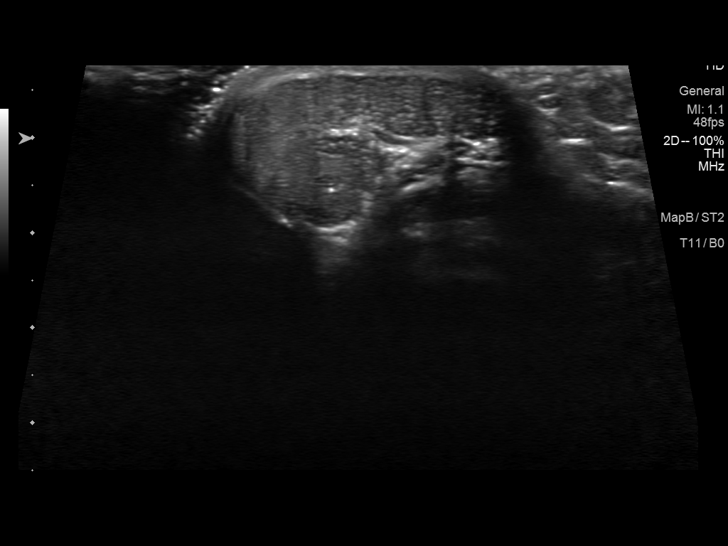
[im 58/77]
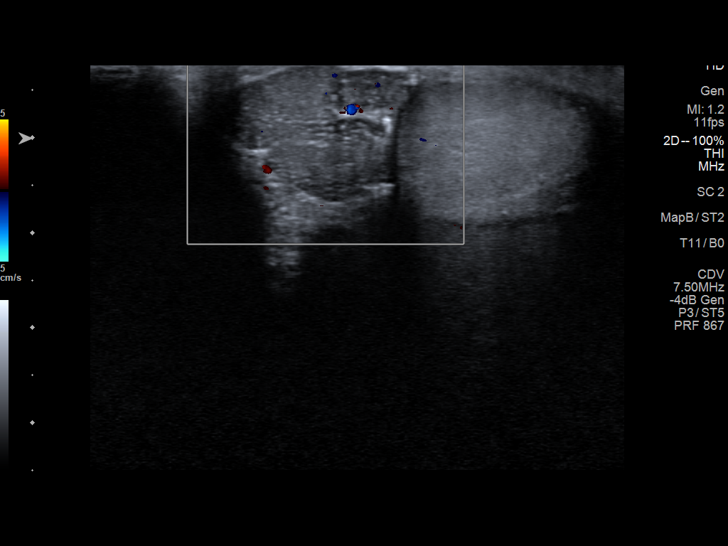
[im 64/77]
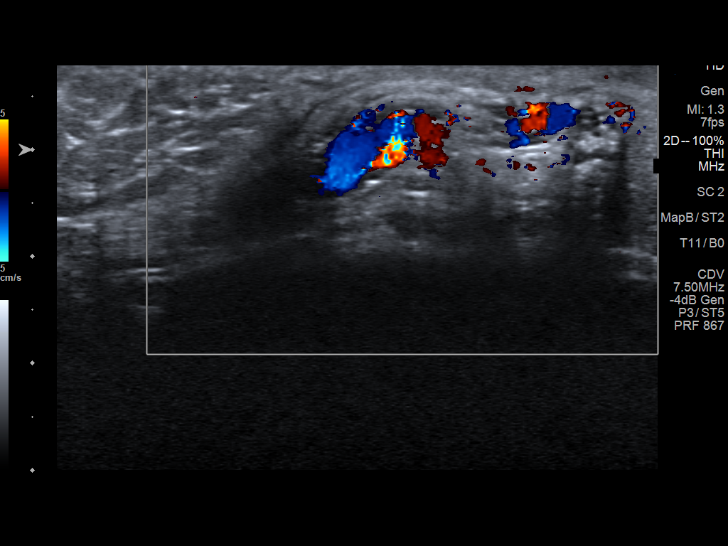
[im 70/77]
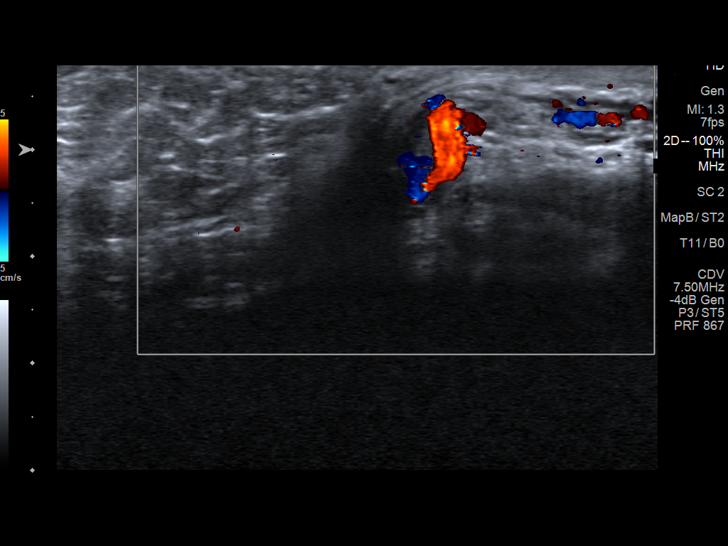
[im 77/77]
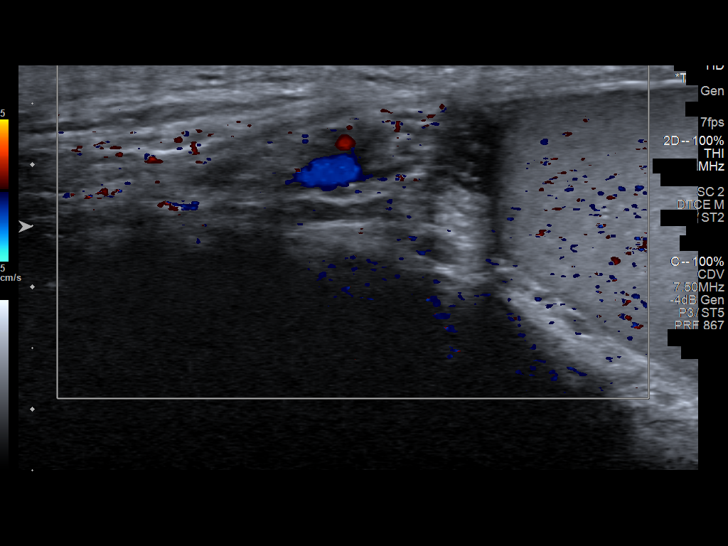

[13 of 25 positions shown; findings below may reference images not displayed]

FINDINGS: Right testicle

Measurements: 5.3 x 2.7 x 3.2 cm. No mass or microlithiasis
visualized.

Left testicle

Measurements: 4.8 x 2.4 x 2.4 cm. No mass or microlithiasis
visualized.

Right epididymis:  Normal in size and appearance.

Left epididymis:  Normal in size and appearance.

Hydrocele:  None visualized.

Varicocele: There is a right varicocele. The area of pain is in the
location of the varicocele. There is no visible inguinal hernia.

Pulsed Doppler interrogation of both testes demonstrates low
resistance arterial and venous waveforms bilaterally.
IMPRESSION: Normal-appearing testicles with normal perfusion to the testicles.
Right varicocele. The possibility of venous obstruction or
retroperitoneal mass should be considered since isolated right-sided
varicoceles are rare.

## 2017-09-26 ENCOUNTER — Encounter: Payer: Self-pay | Admitting: *Deleted

## 2017-09-26 ENCOUNTER — Emergency Department
Admission: EM | Admit: 2017-09-26 | Discharge: 2017-09-26 | Disposition: A | Discharge status: Home / Self Care | Payer: Self-pay | Source: Home / Self Care | Attending: Family Medicine | Admitting: Family Medicine

## 2017-09-26 DIAGNOSIS — L910 Hypertrophic scar: Secondary | ICD-10-CM

## 2017-09-26 NOTE — ED Triage Notes (Signed)
Patient c/o cyst behind right ear x 5 years. It will swell at times and has been drained in the past x 2. Currently inflamed. Denies any drainage.

## 2017-09-26 NOTE — ED Provider Notes (Signed)
[facility]    CSN: [id number] Arrival date & time: 09/26/17  0907     History   Chief Complaint Chief Complaint  Patient presents with  . Cyst    HPI Excell Neyland is a 31 y.o. male.   HPI Reshard Guillet is a 31 y.o. male presenting to UC with c/o growth on the back of his Right earlobe that has been there for 5 years.  He has had this area lanced and drained twice in the past.  Current area has become gradually more painful, swollen and red in the last 5 days.  He has tried warm compresses w/o relief. He did have his ear pierced in the past. Denies fever, chills, n/v/d.    Past Medical History:  Diagnosis Date  . Mouth ulcers     Patient Active Problem List   Diagnosis Date Noted  . SEBACEOUS CYST, INFECTED 06/28/2010    Past Surgical History:  Procedure Laterality Date  . cystectomy earlobe         Home Medications    Prior to Admission medications   Not on File    Family History Family History  Problem Relation Age of Onset  . Diabetes Mother   . Stroke Father     Social History Social History  Substance Use Topics  . Smoking status: Former Smoker    Quit date: 01/09/2013  . Smokeless tobacco: Never Used  . Alcohol use Yes     Comment: 12/week     Allergies   Patient has no known allergies.   Review of Systems Review of Systems  Constitutional: Negative for chills and fever.  Gastrointestinal: Negative for nausea and vomiting.  Musculoskeletal: Negative for arthralgias and myalgias.  Skin: Positive for color change. Negative for wound.       Growth behind Right earlobe      Physical Exam Triage Vital Signs ED Triage Vitals  Enc Vitals Group     BP 09/26/17 0927 (!) 139/94     Pulse Rate 09/26/17 0927 71     Resp --      Temp 09/26/17 0927 98.4 F (36.9 C)     Temp Source 09/26/17 0927 Oral     SpO2 09/26/17 0927 100 %     Weight 09/26/17 0928 200 lb (90.7 kg)     Height --      Head Circumference --      Peak  Flow --      Pain Score 09/26/17 0928 0     Pain Loc --      Pain Edu? --      Excl. in GC? --    No data found.   Updated Vital Signs BP (!) 139/94 (BP Location: Left Arm)   Pulse 71   Temp 98.4 F (36.9 C) (Oral)   Wt 200 lb (90.7 kg)   SpO2 100%   BMI 27.12 kg/m     Physical Exam  Constitutional: He is oriented to person, place, and time. He appears well-developed and well-nourished.  HENT:  Head: Normocephalic and atraumatic.  Right Ear: There is swelling and tenderness.  Ears:  Eyes: EOM are normal.  Neck: Normal range of motion.  Cardiovascular: Normal rate.   Pulmonary/Chest: Effort normal.  Musculoskeletal: Normal range of motion.  Neurological: He is alert and oriented to person, place, and time.  Skin: Skin is warm and dry.  Psychiatric: He has a normal mood and affect. His behavior is normal.  Nursing note and  vitals reviewed.    UC Treatments / Results  Labs (all labs ordered are listed, but only abnormal results are displayed) Labs Reviewed - No data to display  EKG  EKG Interpretation None       Radiology No results found.  Procedures Procedures (including critical care time)  Medications Ordered in UC Medications - No data to display   Initial Impression / Assessment and Plan / UC Course  I have reviewed the triage vital signs and the nursing notes.  Pertinent labs & imaging results that were available during my care of the patient were reviewed by me and considered in my medical decision making (see chart for details).     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  Exam not c/w abscess.  Discussed starting treatment of Keloid with Kenalog rather than I&D, which will likely not be of benefit and will likely worsen size of the scar. Pt agreeable.  Area cleansed with alcohol swab, freeze spray applied.   Kenalog mixed (50/50) with 1% lidocaine slowly injected into keloid.  Slight bleeding easily controlled with  direct pressure. Pt tolerated procedure well.  Advised pt he will likely need 2-3 more treatments to soften and flatten scar to desired size.  Encouraged f/u with his PCP in 4-6 weeks for additional treatments or follow up with a dermatologist.  Pt info packet on Keloids provided.   Final Clinical Impressions(s) / UC Diagnoses   Final diagnoses:  Keloid scar of skin    New Prescriptions There are no discharge medications for this patient.    Controlled Substance Prescriptions [place] Controlled Substance Registry consulted? Not Applicable   Lurene Shadow, PA-C 09/26/17 1053

## 2017-09-26 NOTE — Discharge Instructions (Signed)
° °  Today you were seen for a Keloid scar on the back of your Right ear.  It was treated with a shot of kenalog (a steroid) and lidocaine (numbing medication).  This treatment is commonly used to help soften flatten the appearance of the scar.   This treatment is typically performed every 4-6 weeks.  Often times multiple treatments are needed to get the scar flattened to desired size.    It is recommended that you call your family doctor to see if they provide this type of treatment.  If not, they may refer you to a dermatologist for continued scar treatment/revision.    You may apply over the counter antibiotic ointment on the area if it continues to bleed as this tissue is very delicate.

## 2018-01-15 ENCOUNTER — Encounter: Payer: Self-pay | Admitting: Emergency Medicine

## 2018-01-15 ENCOUNTER — Emergency Department
Admission: EM | Admit: 2018-01-15 | Discharge: 2018-01-15 | Disposition: A | Discharge status: Home / Self Care | Payer: 59 | Source: Home / Self Care | Attending: Family Medicine | Admitting: Family Medicine

## 2018-01-15 ENCOUNTER — Other Ambulatory Visit: Payer: Self-pay

## 2018-01-15 DIAGNOSIS — J101 Influenza due to other identified influenza virus with other respiratory manifestations: Secondary | ICD-10-CM

## 2018-01-15 LAB — POCT INFLUENZA A/B
Influenza A, POC: POSITIVE — AB
Influenza B, POC: NEGATIVE

## 2018-01-15 MED ORDER — OSELTAMIVIR PHOSPHATE 75 MG PO CAPS: 75.0000 mg | ORAL_CAPSULE | Freq: Two times a day (BID) | ORAL | 0 refills | Status: DC

## 2018-01-15 MED ORDER — BENZONATATE 200 MG PO CAPS: ORAL_CAPSULE | ORAL | 0 refills | Status: DC

## 2018-01-15 NOTE — ED Triage Notes (Signed)
Patient states this is day 3 of cough, congestion, fever (high 103), aches. Took acetaminophen 1000mg  at 0830.

## 2018-01-15 NOTE — Discharge Instructions (Signed)
Take plain guaifenesin (1200mg  extended release tabs such as Mucinex) twice daily, with plenty of water, for cough and congestion.  May add Pseudoephedrine (30mg , one or two every 4 to 6 hours) for sinus congestion.  Get adequate rest.   May use Afrin nasal spray (or generic oxymetazoline) each morning for about 5 days and then discontinue.  Also recommend using saline nasal spray several times daily and saline nasal irrigation (AYR is a common brand).   Try warm salt water gargles for sore throat.  Stop all antihistamines for now, and other non-prescription cough/cold preparations. May take Ibuprofen 200mg , 4 tabs every 8 hours with food for fever, body aches, headache, etc.   Recommend a flu shot when well.

## 2018-01-15 NOTE — ED Provider Notes (Signed)
[facility]    CSN: [id number] Arrival date & time: 01/15/18  1124     History   Chief Complaint Chief Complaint  Patient presents with  . Fever  . Generalized Body Aches  . Cough  . Nasal Congestion    HPI Evan Porter is a 32 y.o. male.   Complains of 2 day history flu-like illness including myalgias, headache, fever/chills, fatigue, and cough.  Also has mild nasal congestion but no sore throat.  Cough is non-productive and somewhat worse at night.  No pleuritic pain or shortness of breath.     The history is provided by the patient.    Past Medical History:  Diagnosis Date  . Mouth ulcers     Patient Active Problem List   Diagnosis Date Noted  . SEBACEOUS CYST, INFECTED 06/28/2010    Past Surgical History:  Procedure Laterality Date  . cystectomy earlobe    . HYDROCELE EXCISION / REPAIR         Home Medications    Prior to Admission medications   Medication Sig Start Date End Date Taking? Authorizing Provider  benzonatate (TESSALON) 200 MG capsule Take one cap by mouth at bedtime as needed for cough.  May repeat in 4 to 6 hours 01/15/18   Lattie Haw, MD  oseltamivir (TAMIFLU) 75 MG capsule Take 1 capsule (75 mg total) by mouth every 12 (twelve) hours. 01/15/18   Lattie Haw, MD    Family History Family History  Problem Relation Age of Onset  . Diabetes Mother   . Stroke Father     Social History Social History   Tobacco Use  . Smoking status: Former Smoker    Last attempt to quit: 01/09/2013    Years since quitting: 5.0  . Smokeless tobacco: Never Used  Substance Use Topics  . Alcohol use: Yes    Comment: 12/week  . Drug use: No     Allergies   Patient has no known allergies.   Review of Systems Review of Systems No sore throat + cough No pleuritic pain No wheezing + nasal congestion + post-nasal drainage No sinus pain/pressure No itchy/red eyes No earache No hemoptysis No SOB + fever, + chills No  nausea No vomiting No abdominal pain No diarrhea No urinary symptoms No skin rash + fatigue + myalgias + headache Used OTC meds without relief   Physical Exam Triage Vital Signs ED Triage Vitals  Enc Vitals Group     BP 01/15/18 1144 121/81     Pulse Rate 01/15/18 1144 96     Resp 01/15/18 1144 18     Temp 01/15/18 1144 98.9 F (37.2 C)     Temp Source 01/15/18 1144 Oral     SpO2 01/15/18 1144 97 %     Weight 01/15/18 1144 205 lb (93 kg)     Height 01/15/18 1144 6' (1.829 m)     Head Circumference --      Peak Flow --      Pain Score 01/15/18 1145 2     Pain Loc --      Pain Edu? --      Excl. in GC? --    No data found.  Updated Vital Signs BP 121/81 (BP Location: Right Arm)   Pulse 96   Temp 98.9 F (37.2 C) (Oral)   Resp 18   Ht 6' (1.829 m)   Wt 205 lb (93 kg)   SpO2 97%   BMI 27.80 kg/m  Visual Acuity Right Eye Distance:   Left Eye Distance:   Bilateral Distance:    Right Eye Near:   Left Eye Near:    Bilateral Near:     Physical Exam Nursing notes and Vital Signs reviewed. Appearance:  Patient appears stated age, and in no acute distress Eyes:  Pupils are equal, round, and reactive to light and accomodation.  Extraocular movement is intact.  Conjunctivae are not inflamed  Ears:  Canals normal.  Tympanic membranes normal.  Nose:  Mildly congested turbinates.  No sinus tenderness.   Pharynx:  Normal Neck:  Supple.  Enlarged posterior/lateral nodes are palpated bilaterally, tender to palpation on the left.   Lungs:  Clear to auscultation.  Breath sounds are equal.  Moving air well. Heart:  Regular rate and rhythm without murmurs, rubs, or gallops.  Abdomen:  Nontender without masses or hepatosplenomegaly.  Bowel sounds are present.  No CVA or flank tenderness.  Extremities:  No edema.  Skin:  No rash present.    UC Treatments / Results  Labs (all labs ordered are listed, but only abnormal results are displayed) Labs Reviewed  POCT INFLUENZA  A/B - Abnormal; Notable for the following components:      Result Value   Influenza A, POC Positive (*)    All other components within normal limits    EKG  EKG Interpretation None       Radiology No results found.  Procedures Procedures (including critical care time)  Medications Ordered in UC Medications - No data to display   Initial Impression / Assessment and Plan / UC Course  I have reviewed the triage vital signs and the nursing notes.  Pertinent labs & imaging results that were available during my care of the patient were reviewed by me and considered in my medical decision making (see chart for details).    Begin Tamiflu. Prescription written for Benzonatate [facility](Tessalon) to take at bedtime for night-time cough.  Rx given to spouse for prophylactic Tamiflu. Take plain guaifenesin (1200mg  extended release tabs such as Mucinex) twice daily, with plenty of water, for cough and congestion.  May add Pseudoephedrine (30mg , one or two every 4 to 6 hours) for sinus congestion.  Get adequate rest.   May use Afrin nasal spray (or generic oxymetazoline) each morning for about 5 days and then discontinue.  Also recommend using saline nasal spray several times daily and saline nasal irrigation (AYR is a common brand).   Try warm salt water gargles for sore throat.  Stop all antihistamines for now, and other non-prescription cough/cold preparations. May take Ibuprofen 200mg , 4 tabs every 8 hours with food for fever, body aches, headache, etc.   Recommend a flu shot when well.   Followup with Family Doctor if not improved in 5 days.    Final Clinical Impressions(s) / UC Diagnoses   Final diagnoses:  Influenza A    ED Discharge Orders        Ordered    oseltamivir (TAMIFLU) 75 MG capsule  Every 12 hours     01/15/18 1210    benzonatate (TESSALON) 200 MG capsule     01/15/18 1210           Lattie HawBeese, Evan Ahonen A, MD 01/20/18 2206

## 2020-11-24 ENCOUNTER — Emergency Department
Admission: RE | Admit: 2020-11-24 | Discharge: 2020-11-24 | Disposition: A | Discharge status: Home / Self Care | Payer: BC Managed Care – PPO | Source: Ambulatory Visit

## 2020-11-24 ENCOUNTER — Other Ambulatory Visit: Payer: Self-pay

## 2020-11-24 ENCOUNTER — Emergency Department (INDEPENDENT_AMBULATORY_CARE_PROVIDER_SITE_OTHER): Payer: BC Managed Care – PPO

## 2020-11-24 VITALS — BP 143/96 | HR 84 | Temp 98.9°F | Resp 16

## 2020-11-24 DIAGNOSIS — S93401A Sprain of unspecified ligament of right ankle, initial encounter: Secondary | ICD-10-CM | POA: Diagnosis not present

## 2020-11-24 DIAGNOSIS — M25571 Pain in right ankle and joints of right foot: Secondary | ICD-10-CM

## 2020-11-24 DIAGNOSIS — Y9344 Activity, trampolining: Secondary | ICD-10-CM

## 2020-11-24 DIAGNOSIS — R2241 Localized swelling, mass and lump, right lower limb: Secondary | ICD-10-CM

## 2020-11-24 DIAGNOSIS — S93601A Unspecified sprain of right foot, initial encounter: Secondary | ICD-10-CM

## 2020-11-24 DIAGNOSIS — M79671 Pain in right foot: Secondary | ICD-10-CM | POA: Diagnosis not present

## 2020-11-24 NOTE — ED Triage Notes (Signed)
Patient presents to Urgent Care with complaints of right ankle injury since jumping at the trampoline park yesterday. Patient reports at first it felt numb, swelled up last night, applied ice and compression and took tylenol last night. Has not been able to bear weight on the ankle since the injury, presents w/ crutches. Swelling to lateral ankle noted at this time, pain starts from ankle and radiates down into the center of the foot.

## 2020-11-24 NOTE — ED Provider Notes (Signed)
[name]    CSN: [id number] Arrival date & time: 11/24/20  0859      History   Chief Complaint Chief Complaint  Patient presents with  . Ankle Injury    Right    HPI [facility] is a 34 y.o. male.   HPI Jaylon Boylen is a 34 y.o. male presenting to UC with c/o Right ankle pain and swelling radiating into his foot since rolling his ankle at a trampoline park yesterday.  He has applied ice and compression sock and took tylenol.  Unable to bear weight due to pain. He has been using crutches today.  Pain is 1/10, states he took pain medication PTA.    Past Medical History:  Diagnosis Date  . Mouth ulcers     Patient Active Problem List   Diagnosis Date Noted  . SEBACEOUS CYST, INFECTED 06/28/2010    Past Surgical History:  Procedure Laterality Date  . cystectomy earlobe    . HYDROCELE EXCISION / REPAIR         Home Medications    Prior to Admission medications   Medication Sig Start Date End Date Taking? Authorizing Provider  benzonatate (TESSALON) 200 MG capsule Take one cap by mouth at bedtime as needed for cough.  May repeat in 4 to 6 hours 01/15/18   Lattie Haw, MD  oseltamivir (TAMIFLU) 75 MG capsule Take 1 capsule (75 mg total) by mouth every 12 (twelve) hours. 01/15/18   Lattie Haw, MD    Family History Family History  Problem Relation Age of Onset  . Diabetes Mother   . Stroke Father     Social History Social History   Tobacco Use  . Smoking status: Current Some Day Smoker    Types: Cigarettes  . Smokeless tobacco: Never Used  Substance Use Topics  . Alcohol use: Yes    Comment: 12/week  . Drug use: No     Allergies   Patient has no known allergies.   Review of Systems Review of Systems  Musculoskeletal: Positive for arthralgias, gait problem and joint swelling.  Skin: Negative for color change and wound.     Physical Exam Triage Vital Signs ED Triage Vitals  Enc Vitals Group     BP 11/24/20 0913  (!) 143/96     Pulse Rate 11/24/20 0913 84     Resp 11/24/20 0913 16     Temp 11/24/20 0913 98.9 F (37.2 C)     Temp Source 11/24/20 0913 Oral     SpO2 11/24/20 0913 97 %     Weight --      Height --      Head Circumference --      Peak Flow --      Pain Score 11/24/20 0912 1     Pain Loc --      Pain Edu? --      Excl. in GC? --    No data found.  Updated Vital Signs BP (!) 143/96 (BP Location: Right Arm)   Pulse 84   Temp 98.9 F (37.2 C) (Oral)   Resp 16   SpO2 97%   Visual Acuity Right Eye Distance:   Left Eye Distance:   Bilateral Distance:    Right Eye Near:   Left Eye Near:    Bilateral Near:     Physical Exam Vitals and nursing note reviewed.  Constitutional:      Appearance: Normal appearance. He is well-developed.  HENT:  Head: Normocephalic and atraumatic.  Cardiovascular:     Rate and Rhythm: Normal rate and regular rhythm.     Pulses:          Dorsalis pedis pulses are 2+ on the right side.       Posterior tibial pulses are 2+ on the right side.  Pulmonary:     Effort: Pulmonary effort is normal.  Musculoskeletal:        General: Swelling and tenderness present. Normal range of motion.     Cervical back: Normal range of motion.     Comments: Right ankle: moderate edema to lateral malleolus. Tender. Full ROM without crepitus. Right foot: mild tenderness to lateral aspect. Calf is soft, non-tender. Full ROM knee  Skin:    General: Skin is warm and dry.     Capillary Refill: Capillary refill takes less than 2 seconds.  Neurological:     Mental Status: He is alert and oriented to person, place, and time.     Sensory: No sensory deficit.  Psychiatric:        Behavior: Behavior normal.      UC Treatments / Results  Labs (all labs ordered are listed, but only abnormal results are displayed) Labs Reviewed - No data to display  EKG   Radiology DG Ankle Complete Right  Result Date: 11/24/2020 CLINICAL DATA:  Right ankle pain and  swelling after trampoline injury. EXAM: RIGHT ANKLE - COMPLETE 3+ VIEW COMPARISON:  None. FINDINGS: There is no evidence of fracture, dislocation, or joint effusion. There is no evidence of arthropathy or other focal bone abnormality. Soft tissue swelling is noted over lateral malleolus. IMPRESSION: No fracture or dislocation is noted. Soft tissue swelling is noted over lateral malleolus. Electronically Signed   By: Lupita Raider M.D.   On: 11/24/2020 09:48   DG Foot Complete Right  Result Date: 11/24/2020 CLINICAL DATA:  Pain and swelling after jumping on trampoline. EXAM: RIGHT FOOT COMPLETE - 3+ VIEW COMPARISON:  None. FINDINGS: There is no evidence of fracture or dislocation. There is no evidence of arthropathy or other focal bone abnormality. Lateral soft tissue swelling noted. IMPRESSION: 1. No acute bone abnormality. 2. Lateral soft tissue swelling. Electronically Signed   By: Signa Kell M.D.   On: 11/24/2020 09:47    Procedures Procedures (including critical care time)  Medications Ordered in UC Medications - No data to display  Initial Impression / Assessment and Plan / UC Course  I have reviewed the triage vital signs and the nursing notes.  Pertinent labs & imaging results that were available during my care of the patient were reviewed by me and considered in my medical decision making (see chart for details).     Reviewed imaging with pt Pt states he has a boot at home he can wear in addition to using crutches as needed F/u with Sports Medicine  AVS given  Final Clinical Impressions(s) / UC Diagnoses   Final diagnoses:  Moderate right ankle sprain, initial encounter  Right foot sprain, initial encounter     Discharge Instructions      You may take 500mg  acetaminophen every 4-6 hours or in combination with ibuprofen 400-600mg  every 6-8 hours as needed for pain and inflammation.  Call to schedule a follow up appointment with Sports Medicine in 1-2 weeks for  recheck of symptoms and continued treatment of the sprain.     ED Prescriptions    None     PDMP not reviewed this encounter.  Lurene Shadow, [place] 11/24/20 1216

## 2020-11-24 NOTE — Discharge Instructions (Signed)
  You may take 500mg  acetaminophen every 4-6 hours or in combination with ibuprofen 400-600mg  every 6-8 hours as needed for pain and inflammation.  Call to schedule a follow up appointment with Sports Medicine in 1-2 weeks for recheck of symptoms and continued treatment of the sprain.

## 2022-10-08 IMAGING — DX DG FOOT COMPLETE 3+V*R*
1 series · 1 of 1 positions shown · non-contrast
Comparison: None.

CLINICAL DATA: Pain and swelling after jumping on trampoline.

EXAM:
RIGHT FOOT COMPLETE - 3+ VIEW

[foot obl]
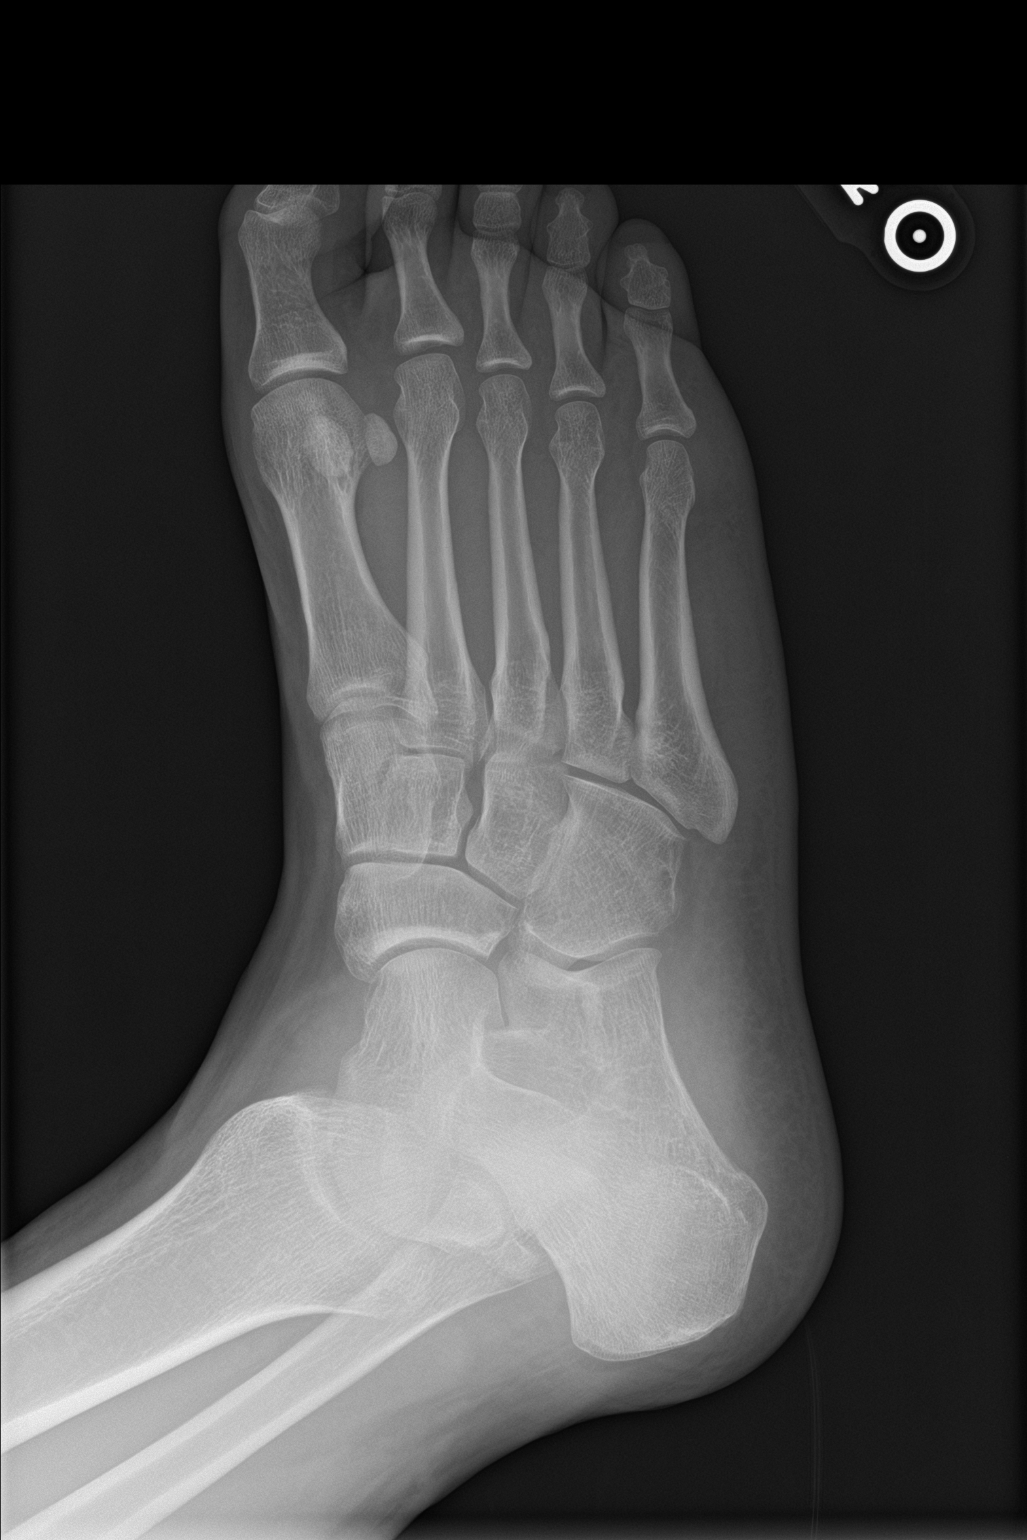

[1 of 1 positions shown; findings below may reference images not displayed]

FINDINGS: There is no evidence of fracture or dislocation. There is no
evidence of arthropathy or other focal bone abnormality. Lateral
soft tissue swelling noted.
IMPRESSION: 1. No acute bone abnormality.
2. Lateral soft tissue swelling.

## 2023-12-26 ENCOUNTER — Other Ambulatory Visit: Payer: Self-pay

## 2023-12-26 ENCOUNTER — Ambulatory Visit
Admission: EM | Admit: 2023-12-26 | Discharge: 2023-12-26 | Disposition: A | Discharge status: Home / Self Care | Payer: BC Managed Care – PPO | Attending: Family Medicine | Admitting: Family Medicine

## 2023-12-26 DIAGNOSIS — J209 Acute bronchitis, unspecified: Secondary | ICD-10-CM | POA: Diagnosis not present

## 2023-12-26 DIAGNOSIS — J019 Acute sinusitis, unspecified: Secondary | ICD-10-CM

## 2023-12-26 MED ORDER — DOXYCYCLINE HYCLATE 100 MG PO CAPS: 100.0000 mg | ORAL_CAPSULE | Freq: Two times a day (BID) | ORAL | 0 refills | Status: AC

## 2023-12-26 MED ORDER — BENZONATATE 200 MG PO CAPS: 200.0000 mg | ORAL_CAPSULE | Freq: Three times a day (TID) | ORAL | 0 refills | Status: AC | PRN

## 2023-12-26 NOTE — ED Triage Notes (Signed)
Head congestion started 2 weeks ago, had cough as well. Cough has lingered, worsening 3 days ago. Head congestion is now back as of 3 days ago as well. No fever. Has been taking mucinex, which helps some. Cough worse at night or laughing/talking.

## 2023-12-26 NOTE — Discharge Instructions (Signed)
Take the doxycycline 2 times a day.  This medication with food Take benzonatate (Tessalon) pills 2-3 times a day.  This will help reduce cough May take a DM cough medicine in addition like Mucinex DM or Delsym Make sure you are drinking lots of water See your doctor if not improving by next week

## 2023-12-26 NOTE — ED Provider Notes (Signed)
[name]    CSN: [id number] Arrival date & time: 12/26/23  0846      History   Chief Complaint Chief Complaint  Patient presents with   Cough    HPI Evan Porter is a 37 y.o. male.   Patient states has had a sinus infection, runny stuffy nose, purulent drainage, postnasal drip and cough for 2 weeks.  He states it gets better and then gets worse again.  Over the last 3 days it has been worse.  He is doing a lot of coughing.  Coughing up sputum.  Tired from not sleeping.  He has been exposed to walking pneumonia.  Prior smoker.  No underlying lung disease or asthma.    Past Medical History:  Diagnosis Date   Mouth ulcers     Patient Active Problem List   Diagnosis Date Noted   SEBACEOUS CYST, INFECTED 06/28/2010    Past Surgical History:  Procedure Laterality Date   cystectomy earlobe     HYDROCELE EXCISION / REPAIR         Home Medications    Prior to Admission medications   Medication Sig Start Date End Date Taking? Authorizing Provider  benzonatate (TESSALON) 200 MG capsule Take 1 capsule (200 mg total) by mouth 3 (three) times daily as needed for cough. 12/26/23  Yes Eustace Moore, MD  doxycycline (VIBRAMYCIN) 100 MG capsule Take 1 capsule (100 mg total) by mouth 2 (two) times daily. 12/26/23  Yes Eustace Moore, MD  metoprolol tartrate (LOPRESSOR) 50 MG tablet Take 50 mg by mouth 2 (two) times daily.   Yes [provider]    Family History Family History  Problem Relation Age of Onset   Diabetes Mother    Stroke Father     Social History Social History   Tobacco Use   Smoking status: Former    Types: Cigarettes   Smokeless tobacco: Never  Substance Use Topics   Alcohol use: Not Currently    Comment: 12/week   Drug use: No     Allergies   Patient has no known allergies.   Review of Systems Review of Systems See HPI  Physical Exam Triage Vital Signs ED Triage Vitals  Encounter Vitals Group     BP  12/26/23 0851 (!) 163/74     Systolic BP Percentile --      Diastolic BP Percentile --      Pulse Rate 12/26/23 0851 85     Resp 12/26/23 0851 16     Temp 12/26/23 0851 98.2 F (36.8 C)     Temp Source 12/26/23 0851 Oral     SpO2 12/26/23 0851 99 %     Weight --      Height --      Head Circumference --      Peak Flow --      Pain Score 12/26/23 0854 0     Pain Loc --      Pain Education --      Exclude from Growth Chart --    No data found.  Updated Vital Signs BP (!) 163/74   Pulse 85   Temp 98.2 F (36.8 C) (Oral)   Resp 16   SpO2 99%       Physical Exam Constitutional:      General: He is not in acute distress.    Appearance: He is well-developed.  HENT:     Head: Normocephalic and atraumatic.     Right Ear: There  is impacted cerumen.     Left Ear: There is impacted cerumen.     Nose: Congestion present.     Mouth/Throat:     Mouth: Mucous membranes are moist.     Pharynx: No posterior oropharyngeal erythema.  Eyes:     Conjunctiva/sclera: Conjunctivae normal.     Pupils: Pupils are equal, round, and reactive to light.  Cardiovascular:     Rate and Rhythm: Normal rate.     Heart sounds: Normal heart sounds.  Pulmonary:     Effort: Pulmonary effort is normal. No respiratory distress.     Breath sounds: Normal breath sounds.  Abdominal:     General: There is no distension.     Palpations: Abdomen is soft.  Musculoskeletal:        General: Normal range of motion.     Cervical back: Normal range of motion.  Skin:    General: Skin is warm and dry.  Neurological:     Mental Status: He is alert.      UC Treatments / Results  Labs (all labs ordered are listed, but only abnormal results are displayed) Labs Reviewed - No data to display  EKG   Radiology No results found.  Procedures Procedures (including critical care time)  Medications Ordered in UC Medications - No data to display  Initial Impression / Assessment and Plan / UC Course  I  have reviewed the triage vital signs and the nursing notes.  Pertinent labs & imaging results that were available during my care of the patient were reviewed by me and considered in my medical decision making (see chart for details).     Final Clinical Impressions(s) / UC Diagnoses   Final diagnoses:  Acute bronchitis, unspecified organism  Acute sinusitis with symptoms greater than 10 days     Discharge Instructions      Take the doxycycline 2 times a day.  This medication with food Take benzonatate (Tessalon) pills 2-3 times a day.  This will help reduce cough May take a DM cough medicine in addition like Mucinex DM or Delsym Make sure you are drinking lots of water See your doctor if not improving by next week   ED Prescriptions     Medication Sig Dispense Auth. Provider   doxycycline (VIBRAMYCIN) 100 MG capsule Take 1 capsule (100 mg total) by mouth 2 (two) times daily. 20 capsule Eustace Moore, MD   benzonatate (TESSALON) 200 MG capsule Take 1 capsule (200 mg total) by mouth 3 (three) times daily as needed for cough. 21 capsule Eustace Moore, MD      PDMP not reviewed this encounter.   Eustace Moore, MD 12/26/23 [phone]
